# Patient Record
Sex: Male | Born: 1965 | Race: White | Hispanic: No | Marital: Married | Smoking: Never smoker
Health system: Southern US, Community
[De-identification: ages and names within clinical notes are randomized; demographics above are authoritative.]

## PROBLEM LIST (undated history)

## (undated) DIAGNOSIS — F329 Major depressive disorder, single episode, unspecified: Secondary | ICD-10-CM

## (undated) DIAGNOSIS — R51 Headache: Secondary | ICD-10-CM

## (undated) DIAGNOSIS — I10 Essential (primary) hypertension: Secondary | ICD-10-CM

## (undated) DIAGNOSIS — F32A Depression, unspecified: Secondary | ICD-10-CM

## (undated) DIAGNOSIS — N200 Calculus of kidney: Secondary | ICD-10-CM

## (undated) HISTORY — PX: WRIST SURGERY: SHX841

---

## 2007-09-10 ENCOUNTER — Emergency Department: Payer: Self-pay | Admitting: Emergency Medicine

## 2011-03-09 DIAGNOSIS — N200 Calculus of kidney: Secondary | ICD-10-CM

## 2011-03-09 HISTORY — DX: Calculus of kidney: N20.0

## 2011-11-04 ENCOUNTER — Other Ambulatory Visit: Payer: Self-pay | Admitting: Family Medicine

## 2011-11-04 DIAGNOSIS — R109 Unspecified abdominal pain: Secondary | ICD-10-CM

## 2011-11-05 ENCOUNTER — Ambulatory Visit
Admission: RE | Admit: 2011-11-05 | Discharge: 2011-11-05 | Disposition: A | Discharge status: Home / Self Care | Payer: Managed Care, Other (non HMO) | Source: Ambulatory Visit | Attending: Family Medicine | Admitting: Family Medicine

## 2011-11-05 DIAGNOSIS — R109 Unspecified abdominal pain: Secondary | ICD-10-CM

## 2011-11-10 ENCOUNTER — Other Ambulatory Visit: Payer: Self-pay | Admitting: Urology

## 2011-11-10 ENCOUNTER — Encounter (HOSPITAL_BASED_OUTPATIENT_CLINIC_OR_DEPARTMENT_OTHER): Payer: Self-pay | Admitting: *Deleted

## 2011-11-10 NOTE — Progress Notes (Signed)
To [facility] at  0615 -Istat,Ekg on arrival,Npo after Mn-will take amlodipine w/ sip water that am.

## 2011-11-15 NOTE — H&P (Signed)
History of Present Illness      This is a new patient referred by PA Lovenia Kim in consultation for ureteral stone. Patient was seen November 02, 2011 or about a week ago when he had a three-day history of left flank pain. He was given Toradol in the office. He was started on tamsulosin. His pain is better today. He has not passed a stone.   He underwent CT of the abdomen and pelvis without contrast November 05, 2011 and I reviewed the images. There was an 8 mm left distal calculus. More proximally there was an 8mm calculus at the junction of proximal to mid ureter. There was proximal hydroureteronephrosis. There were no renal, right ureter or bladder calculi. The calculi were visible on the scout image. He also had a KUB on November 02, 2011 and I reviewed this image. It was reversed when it was put into PACS as there was no left or right marker. It was dictated as a right distal and right mid ureteral stone. As the CT shows the stones are on the left.  There are no other associated signs or symptoms. There are no other aggravating or alleviating factors.  The patient has a history of kidney stones. He has always passed them.    Past Medical History Problems  1. History of  Anxiety (Symptom) 300.00 2. History of  Hypertension 401.9 3. History of  Nephrolithiasis V13.01  Surgical History Problems  1. History of  Complete Colonoscopy  Current Meds 1. AmLODIPine Besylate 5 MG Oral Tablet; Therapy: 13Jun2013 to 2. Hydrocodone-Acetaminophen 5-325 MG Oral Tablet; Therapy: 30Aug2013 to 3. Lisinopril 10 MG Oral Tablet; Therapy: (Recorded:03Sep2013) to 4. Oxycodone-Acetaminophen 5-325 MG Oral Tablet; Therapy: 27Aug2013 to 5. Tamsulosin HCl 0.4 MG Oral Capsule; Therapy: 27Aug2013 to 6. Venlafaxine HCl ER 75 MG Oral Capsule Extended Release 24 Hour; Therapy:  (Recorded:03Sep2013) to  Allergies Medication  1. Codeine Derivatives  Family History Problems  1. Family history of  Death In The Family  Mother age 58 of lung cancer 2. Maternal history of  Lung Cancer V16.1  Social History Problems    Caffeine Use 2 a day   Marital History - Currently Married   Occupation: Currently umemployed Denied    History of  Alcohol Use   History of  Tobacco Use 305.1  Review of Systems Genitourinary, constitutional, skin, eye, otolaryngeal, hematologic/lymphatic, cardiovascular, pulmonary, endocrine, musculoskeletal, gastrointestinal, neurological and psychiatric system(s) were reviewed and pertinent findings if present are noted.  Gastrointestinal: abdominal pain, diarrhea and constipation.  Musculoskeletal: back pain.    Vitals Vital Signs [Data Includes: Last 1 Day]  04Sep2013 09:22AM  BMI Calculated: 29.85 BSA Calculated: 2.2 Height: 5 ft 11.5 in Weight: 218 lb  Blood Pressure: 127 / 86 Temperature: 98.2 F Heart Rate: 76  Physical Exam Constitutional: Well nourished and well developed . No acute distress.  ENT:. The ears and nose are normal in appearance.  Neck: The appearance of the neck is normal and no neck mass is present.  Pulmonary: No respiratory distress and normal respiratory rhythm and effort.  Cardiovascular: Heart rate and rhythm are normal . No peripheral edema.  Abdomen: The abdomen is soft and nontender. No masses are palpated. No CVA tenderness. No hernias are palpable. No hepatosplenomegaly noted.  Lymphatics: The femoral and inguinal nodes are not enlarged or tender.  Skin: Normal skin turgor, no visible rash and no visible skin lesions.  Neuro/Psych:. Mood and affect are appropriate.    Results/Data  Old records or  history reviewed: 10 pages.  The following images/tracing/specimen were independently visualized: Marland Kitchen    Assessment Assessed  1. Microscopic Hematuria 599.72 2. Pyuria 791.9 3. Ureteral Stone 592.1 4. Hydronephrosis 591  Plan Health Maintenance (V70.0)  1. UA With REFLEX  Done: 04Sep2013 Microscopic Hematuria (599.72), Pyuria  (791.9)  2. URINE CULTURE  Requested for: 04Sep2013 Ureteral Stone (592.1)  3. Follow-up Schedule Surgery Office  Follow-up  Done: 04Sep2013  Discussion/Summary   Urine sent for Cx as a precaution.   Using the understanding kidney stones handout,  I discussed with patient CT and KUB findings. We discussed the nature, risks and benefits of continued stone passage with medical expulsion therapy (alpha blockers), endoscopic management with cysto/stent/ureteroscopy/laser lithotripsy (fail to gain retrograde access, need for repeat therapy/multiple procedures, ureteral injury, stent pain among others), ESWL (failure to fragment, failure to pass fragments, obstruction, need for additional procedures/stenting, bleeding requiring transfusion or embolization, infection, damage to kidney/ureter/other structures, pain among others). All questions answered. Pt elects to proceed with endoscopic management. We discussed treatment goals may not be reached and/or may require multiple/repeat procedures. We discussed the likelihood of achieving the goals of the procedure and potential problems that might occur during the procedure or recuperation.     cc: PA Lovenia Kim   Signatures Electronically signed by : Jerilee Field, M.D.; Nov 10 2011  9:52AM

## 2011-11-16 ENCOUNTER — Ambulatory Visit (HOSPITAL_BASED_OUTPATIENT_CLINIC_OR_DEPARTMENT_OTHER)
Admission: RE | Admit: 2011-11-16 | Discharge: 2011-11-16 | Disposition: A | Discharge status: Home / Self Care | Payer: Managed Care, Other (non HMO) | Source: Ambulatory Visit | Attending: Urology | Admitting: Urology

## 2011-11-16 ENCOUNTER — Encounter (HOSPITAL_BASED_OUTPATIENT_CLINIC_OR_DEPARTMENT_OTHER): Payer: Self-pay

## 2011-11-16 ENCOUNTER — Encounter (HOSPITAL_BASED_OUTPATIENT_CLINIC_OR_DEPARTMENT_OTHER)
Admission: RE | Disposition: A | Discharge status: Home / Self Care | Payer: Self-pay | Source: Ambulatory Visit | Attending: Urology

## 2011-11-16 ENCOUNTER — Encounter (HOSPITAL_BASED_OUTPATIENT_CLINIC_OR_DEPARTMENT_OTHER): Payer: Self-pay | Admitting: Anesthesiology

## 2011-11-16 ENCOUNTER — Ambulatory Visit (HOSPITAL_BASED_OUTPATIENT_CLINIC_OR_DEPARTMENT_OTHER): Payer: Managed Care, Other (non HMO) | Admitting: Anesthesiology

## 2011-11-16 DIAGNOSIS — I10 Essential (primary) hypertension: Secondary | ICD-10-CM | POA: Insufficient documentation

## 2011-11-16 DIAGNOSIS — Z79899 Other long term (current) drug therapy: Secondary | ICD-10-CM | POA: Insufficient documentation

## 2011-11-16 DIAGNOSIS — N1330 Unspecified hydronephrosis: Secondary | ICD-10-CM | POA: Insufficient documentation

## 2011-11-16 DIAGNOSIS — N201 Calculus of ureter: Secondary | ICD-10-CM | POA: Insufficient documentation

## 2011-11-16 HISTORY — DX: Major depressive disorder, single episode, unspecified: F32.9

## 2011-11-16 HISTORY — DX: Essential (primary) hypertension: I10

## 2011-11-16 HISTORY — DX: Calculus of kidney: N20.0

## 2011-11-16 HISTORY — DX: Depression, unspecified: F32.A

## 2011-11-16 HISTORY — PX: URETEROSCOPY: SHX842

## 2011-11-16 LAB — POCT I-STAT 4, (NA,K, GLUC, HGB,HCT)
Hemoglobin: 13.9 g/dL (ref 13.0–17.0)
Potassium: 3.8 mEq/L (ref 3.5–5.1)
Sodium: 139 mEq/L (ref 135–145)

## 2011-11-16 SURGERY — URETEROSCOPY
Anesthesia: General | Site: Ureter | Laterality: Left | Wound class: Clean Contaminated

## 2011-11-16 MED ORDER — IOHEXOL 350 MG/ML SOLN
INTRAVENOUS | Status: DC | PRN
  Administered 2011-11-16: 6 mL

## 2011-11-16 MED ORDER — OXYCODONE HCL 5 MG/5ML PO SOLN: 5.0000 mg | Freq: Once | ORAL | Status: DC | PRN

## 2011-11-16 MED ORDER — OXYCODONE-ACETAMINOPHEN 5-325 MG PO TABS: 1.0000 | ORAL_TABLET | ORAL | Status: AC | PRN

## 2011-11-16 MED ORDER — GLYCOPYRROLATE 0.2 MG/ML IJ SOLN
INTRAMUSCULAR | Status: DC | PRN
  Administered 2011-11-16: 0.2 mg via INTRAVENOUS

## 2011-11-16 MED ORDER — OXYCODONE-ACETAMINOPHEN 5-325 MG PO TABS
1.0000 | ORAL_TABLET | Freq: Once | ORAL | Status: AC
  Administered 2011-11-16: 1 via ORAL

## 2011-11-16 MED ORDER — CEFAZOLIN SODIUM-DEXTROSE 2-3 GM-% IV SOLR
2.0000 g | INTRAVENOUS | Status: AC
  Administered 2011-11-16: 2 g via INTRAVENOUS

## 2011-11-16 MED ORDER — LIDOCAINE HCL (CARDIAC) 20 MG/ML IV SOLN
INTRAVENOUS | Status: DC | PRN
  Administered 2011-11-16: 75 mg via INTRAVENOUS

## 2011-11-16 MED ORDER — CIPROFLOXACIN HCL 500 MG PO TABS: 500.0000 mg | ORAL_TABLET | Freq: Two times a day (BID) | ORAL | Status: AC

## 2011-11-16 MED ORDER — CEFAZOLIN SODIUM 1-5 GM-% IV SOLN: 1.0000 g | INTRAVENOUS | Status: DC

## 2011-11-16 MED ORDER — BELLADONNA ALKALOIDS-OPIUM 16.2-60 MG RE SUPP
RECTAL | Status: DC | PRN
  Administered 2011-11-16: 1 via RECTAL

## 2011-11-16 MED ORDER — ACETAMINOPHEN 10 MG/ML IV SOLN: 1000.0000 mg | Freq: Once | INTRAVENOUS | Status: DC | PRN

## 2011-11-16 MED ORDER — OXYCODONE HCL 5 MG PO TABS: 5.0000 mg | ORAL_TABLET | Freq: Once | ORAL | Status: DC | PRN

## 2011-11-16 MED ORDER — HYDROMORPHONE HCL PF 1 MG/ML IJ SOLN: 0.2500 mg | INTRAMUSCULAR | Status: DC | PRN

## 2011-11-16 MED ORDER — LACTATED RINGERS IV SOLN
INTRAVENOUS | Status: DC
  Administered 2011-11-16 (×3): via INTRAVENOUS

## 2011-11-16 MED ORDER — DEXAMETHASONE SODIUM PHOSPHATE 4 MG/ML IJ SOLN
INTRAMUSCULAR | Status: DC | PRN
  Administered 2011-11-16: 10 mg via INTRAVENOUS

## 2011-11-16 MED ORDER — PROPOFOL 10 MG/ML IV BOLUS
INTRAVENOUS | Status: DC | PRN
  Administered 2011-11-16: 220 mg via INTRAVENOUS

## 2011-11-16 MED ORDER — FENTANYL CITRATE 0.05 MG/ML IJ SOLN
INTRAMUSCULAR | Status: DC | PRN
  Administered 2011-11-16 (×2): 25 ug via INTRAVENOUS
  Administered 2011-11-16: 50 ug via INTRAVENOUS
  Administered 2011-11-16 (×5): 25 ug via INTRAVENOUS
  Administered 2011-11-16: 50 ug via INTRAVENOUS
  Administered 2011-11-16 (×2): 25 ug via INTRAVENOUS

## 2011-11-16 MED ORDER — PROMETHAZINE HCL 25 MG/ML IJ SOLN: 6.2500 mg | INTRAMUSCULAR | Status: DC | PRN

## 2011-11-16 MED ORDER — MIDAZOLAM HCL 5 MG/5ML IJ SOLN
INTRAMUSCULAR | Status: DC | PRN
  Administered 2011-11-16: 1 mg via INTRAVENOUS

## 2011-11-16 MED ORDER — MEPERIDINE HCL 25 MG/ML IJ SOLN: 6.2500 mg | INTRAMUSCULAR | Status: DC | PRN

## 2011-11-16 MED ORDER — ONDANSETRON HCL 4 MG/2ML IJ SOLN
INTRAMUSCULAR | Status: DC | PRN
  Administered 2011-11-16: 4 mg via INTRAVENOUS

## 2011-11-16 MED ORDER — EPHEDRINE SULFATE 50 MG/ML IJ SOLN
INTRAMUSCULAR | Status: DC | PRN
  Administered 2011-11-16: 10 mg via INTRAVENOUS
  Administered 2011-11-16 (×2): 15 mg via INTRAVENOUS
  Administered 2011-11-16: 10 mg via INTRAVENOUS

## 2011-11-16 MED ORDER — SODIUM CHLORIDE 0.9 % IR SOLN
Status: DC | PRN
  Administered 2011-11-16: 6000 mL

## 2011-11-16 SURGICAL SUPPLY — 39 items
ADAPTER CATH URET PLST 4-6FR (CATHETERS) ×2 IMPLANT
BAG DRAIN URO-CYSTO SKYTR STRL (DRAIN) ×2 IMPLANT
BASKET LASER NITINOL 1.9FR (BASKET) ×2 IMPLANT
BASKET STNLS GEMINI 4WIRE 3FR (BASKET) IMPLANT
BASKET ZERO TIP NITINOL 2.4FR (BASKET) IMPLANT
BRUSH URET BIOPSY 3F (UROLOGICAL SUPPLIES) IMPLANT
CANISTER SUCT LVC 12 LTR MEDI- (MISCELLANEOUS) ×2 IMPLANT
CATH INTERMIT  6FR 70CM (CATHETERS) IMPLANT
CATH URET 5FR 28IN CONE TIP (BALLOONS)
CATH URET 5FR 28IN OPEN ENDED (CATHETERS) ×2 IMPLANT
CATH URET 5FR 70CM CONE TIP (BALLOONS) IMPLANT
CATH URET DUAL LUMEN 6-10FR 50 (CATHETERS) ×2 IMPLANT
CLOTH BEACON ORANGE TIMEOUT ST (SAFETY) ×2 IMPLANT
DRAPE CAMERA CLOSED 9X96 (DRAPES) ×2 IMPLANT
ELECT REM PT RETURN 9FT ADLT (ELECTROSURGICAL)
ELECTRODE REM PT RTRN 9FT ADLT (ELECTROSURGICAL) IMPLANT
FIBER LASER TRAC TIP (UROLOGICAL SUPPLIES) ×4 IMPLANT
GLOVE BIO SURGEON STRL SZ7.5 (GLOVE) ×2 IMPLANT
GLOVE BIOGEL PI IND STRL 7.0 (GLOVE) ×1 IMPLANT
GLOVE BIOGEL PI INDICATOR 7.0 (GLOVE) ×1
GLOVE INDICATOR 6.5 STRL GRN (GLOVE) ×2 IMPLANT
GOWN PREVENTION PLUS LG XLONG (DISPOSABLE) ×2 IMPLANT
GOWN STRL REIN XL XLG (GOWN DISPOSABLE) ×2 IMPLANT
GUIDEWIRE 0.038 PTFE COATED (WIRE) IMPLANT
GUIDEWIRE ANG ZIPWIRE 038X150 (WIRE) ×2 IMPLANT
GUIDEWIRE STR DUAL SENSOR (WIRE) ×2 IMPLANT
IV NS IRRIG 3000ML ARTHROMATIC (IV SOLUTION) ×4 IMPLANT
KIT BALLIN UROMAX 15FX10 (LABEL) IMPLANT
KIT BALLN UROMAX 15FX4 (MISCELLANEOUS) IMPLANT
KIT BALLN UROMAX 26 75X4 (MISCELLANEOUS)
LASER FIBER DISP (UROLOGICAL SUPPLIES) IMPLANT
PACK CYSTOSCOPY (CUSTOM PROCEDURE TRAY) ×2 IMPLANT
SET HIGH PRES BAL DIL (LABEL)
SHEATH ACCESS URETERAL 38CM (SHEATH) ×2 IMPLANT
SHEATH ACCESS URETERAL 54CM (SHEATH) IMPLANT
SHEATH URET ACCESS 12FR/35CM (UROLOGICAL SUPPLIES) IMPLANT
SHEATH URET ACCESS 12FR/55CM (UROLOGICAL SUPPLIES) IMPLANT
STENT URET 6FRX26 CONTOUR (STENTS) ×2 IMPLANT
SYRINGE IRR TOOMEY STRL 70CC (SYRINGE) IMPLANT

## 2011-11-16 NOTE — Anesthesia Procedure Notes (Signed)
Procedure Name: LMA Insertion Date/Time: 11/16/2011 7:36 AM Performed by: Fran Lowes Pre-anesthesia Checklist: Patient identified, Emergency Drugs available, Suction available and Patient being monitored Patient Re-evaluated:Patient Re-evaluated prior to inductionOxygen Delivery Method: Circle System Utilized Preoxygenation: Pre-oxygenation with 100% oxygen Intubation Type: IV induction Ventilation: Mask ventilation without difficulty LMA: LMA inserted LMA Size: 4.0 Number of attempts: 1 Airway Equipment and Method: bite block Placement Confirmation: positive ETCO2 Tube secured with: Tape Dental Injury: Teeth and Oropharynx as per pre-operative assessment

## 2011-11-16 NOTE — Interval H&P Note (Signed)
History and Physical Interval Note:  11/16/2011 7:31 AM  Jerry Vance  has presented today for surgery, with the diagnosis of LEFT URETERAL STONES AND LEFT HYDRONEPHROSIS  The various methods of treatment have been discussed with the patient and family. After consideration of risks, benefits and other options for treatment, the patient has consented to  Procedure(s) (LRB) with comments: URETEROSCOPY (Left) - LEFT URETEROSCOPY LASER LITHOTRIPSY AND STENT PLACEMENT HOLMIUM C-ARM HOLMIUM LASER APPLICATION (Left) as a surgical intervention .  The patient's history has been reviewed, patient examined, no change in status, stable for surgery.  I have reviewed the patient's chart and labs.  Questions were answered to the patient's satisfaction.  We discussed there is a good chance of needing a staged procedure given his stone burden and proximal nature of some of the stones.    Antony Haste

## 2011-11-16 NOTE — Anesthesia Preprocedure Evaluation (Signed)
Anesthesia Evaluation  Patient identified by MRN, date of birth, ID band Patient awake    Reviewed: Allergy & Precautions, H&P , NPO status , Patient's Chart, lab work & pertinent test results  Airway Mallampati: II TM Distance: >3 FB Neck ROM: Full    Dental  (+) Dental Advisory Given   Pulmonary neg pulmonary ROS,  breath sounds clear to auscultation  Pulmonary exam normal       Cardiovascular hypertension, Pt. on medications Rhythm:Regular Rate:Normal     Neuro/Psych Depression negative neurological ROS     GI/Hepatic negative GI ROS, Neg liver ROS,   Endo/Other  negative endocrine ROS  Renal/GU Renal disease     Musculoskeletal negative musculoskeletal ROS (+)   Abdominal   Peds  Hematology negative hematology ROS (+)   Anesthesia Other Findings   Reproductive/Obstetrics                           Anesthesia Physical Anesthesia Plan  ASA: II  Anesthesia Plan: General   Post-op Pain Management:    Induction: Intravenous  Airway Management Planned: LMA  Additional Equipment:   Intra-op Plan:   Post-operative Plan:   Informed Consent: I have reviewed the patients History and Physical, chart, labs and discussed the procedure including the risks, benefits and alternatives for the proposed anesthesia with the patient or authorized representative who has indicated his/her understanding and acceptance.   Dental advisory given  Plan Discussed with: CRNA and Surgeon  Anesthesia Plan Comments:         Anesthesia Quick Evaluation

## 2011-11-16 NOTE — Brief Op Note (Addendum)
11/16/2011  9:21 AM  PATIENT:  Jerry Vance  46 y.o. male  PRE-OPERATIVE DIAGNOSIS:  LEFT URETERAL STONES AND LEFT HYDRONEPHROSIS  POST-OPERATIVE DIAGNOSIS:  LEFT URETERAL STONES AND LEFT HYDRONEPHROSIS  PROCEDURE:  Procedure(s) (LRB) with comments: URETEROSCOPY (Left) HOLMIUM LASER APPLICATION (Left) CYSTOSCOPY WITH STENT PLACEMENT (Left) Left retrograde pyelogram   SURGEON:  Surgeon(s) and Role:    * Antony Haste, MD - Primary   ANESTHESIA:   general  EBL:  Total I/O In: 1000 [I.V.:1000] Out: -   BLOOD ADMINISTERED:none  DRAINS: 6 x 26 cm JJ left ureteral stent -- no string   SPECIMEN:  Source of Specimen:  left ureteral stone fragments  DISPOSITION OF SPECIMEN:  N/A office lab  DICTATION: .Other Dictation: Dictation Number #[id number]  PLAN OF CARE: Discharge to home after PACU  PATIENT DISPOSITION:  PACU - hemodynamically stable.   Delay start of Pharmacological VTE agent (>24hrs) due to surgical blood loss or risk of bleeding: not applicable

## 2011-11-16 NOTE — Anesthesia Postprocedure Evaluation (Signed)
Anesthesia Post Note  Patient: Jerry Vance  Procedure(s) Performed: Procedure(s) (LRB): URETEROSCOPY (Left) HOLMIUM LASER APPLICATION (Left) CYSTOSCOPY WITH STENT PLACEMENT (Left)  Anesthesia type: General  Patient location: PACU  Post pain: Pain level controlled  Post assessment: Post-op Vital signs reviewed  Last Vitals: BP 139/85  Pulse 97  Temp 36.2 C (Oral)  Resp 18  SpO2 95%  Post vital signs: Reviewed  Level of consciousness: sedated  Complications: No apparent anesthesia complications

## 2011-11-16 NOTE — Transfer of Care (Signed)
Immediate Anesthesia Transfer of Care Note  Patient: Jerry Vance  Procedure(s) Performed: Procedure(s) (LRB): URETEROSCOPY (Left) HOLMIUM LASER APPLICATION (Left) CYSTOSCOPY WITH STENT PLACEMENT (Left)  Patient Location: Patient transported to PACU with oxygen via face mask at 4 Liters / Min  Anesthesia Type: General  Level of Consciousness: awake and alert   Airway & Oxygen Therapy: Patient Spontanous Breathing and Patient connected to face mask oxygen  Post-op Assessment: Report given to PACU RN and Post -op Vital signs reviewed and stable  Post vital signs: Reviewed and stable  Dentition: Teeth and oropharynx remain in pre-op condition  Complications: No apparent anesthesia complications

## 2011-11-17 ENCOUNTER — Encounter (HOSPITAL_BASED_OUTPATIENT_CLINIC_OR_DEPARTMENT_OTHER): Payer: Self-pay | Admitting: Urology

## 2011-11-17 NOTE — Op Note (Signed)
NAME:  Jerry Vance, PALMA NO.:  [id number]  MEDICAL RECORD NO.:  [id number]  LOCATION:                               FACILITY:  [facility]  PHYSICIAN:  Jerilee Field, MD   DATE OF BIRTH:  04-10-1965  DATE OF PROCEDURE:  11/16/2011 DATE OF DISCHARGE:                              OPERATIVE REPORT   PREOPERATIVE DIAGNOSIS:  Left ureteral stone, left hydronephrosis.  POSTOPERATIVE DIAGNOSIS:  Left ureteral stone, left hydronephrosis.  PROCEDURE:  Cystoscopy with left retrograde pyelogram, left ureteroscopy, Holmium laser lithotripsy, stone basket extraction, and left ureteral stent placement.  SURGEON:  Jerilee Field, MD  TYPE OF ANESTHESIA:  General.  FINDINGS:  On cystoscopy - the urethra was normal, the prostate was short nonobstructive.  The bladder appeared normal without stone, foreign body, or carcinoma.  Left retrograde pyelogram - for the large filling defect in the left distal ureter consistent with a distal stone with a dilated ureter proximal to this.  Contrast field of dilated proximal ureter, dilated renal pelvis, and dilated collecting system.  DESCRIPTION OF PROCEDURE:  After consent was obtained, the patient was brought to the operating room.  Time-out was performed to confirm the patient and procedure.  After adequate anesthesia, he was placed in a lithotomy position and prepped and draped in the usual sterile fashion. A cystoscope was passed per urethra, and a 6-French open-ended catheter was cannulated into the left ureteral orifice and retrograde injection of contrast was performed.  A Sensor wire was then advanced past the distal stone, but would not pass the proximal stone.  The 6-French open- ended catheter was then advanced into the ureter up to the proximal stone and now bracing the Sensor wire, it advanced past the proximal stone and was coiled in the kidney.  A 6-French open-ended catheter would not pass the stone as the stone was  large in size and impacted. Therefore the 6-French open-ended catheter was removed and a 5-French open-ended catheter was advanced, which did go past the proximal stone to the region of the left renal pelvis.  The wire was removed and a 2nd retrograde injection of contrast was performed to confirm proper wire placement.  These findings were previously dictated.  The Sensor wire was then coiled back in the kidney and the 6-French open-ended catheter removed.  I then tried to pass the ureteroscope into the distal ureter, but the ureterovesical junction was too tight, therefore it was gently dilated with ureteral access sheath without difficulty.  Now the rigid ureteroscope was advanced into the left distal ureter, where the left distal ureter was dilated and a large stone was found.  The stone was then fragmented with 200-micron laser fiber at a setting of 0.6 and 15 small fragments.  Some of these fragments were removed with the escape basket.  Some of the clinically insignificant fragments simply washed away.  Having cleared the distal stone, I tried to access the proximal stone with the rigid scope, but it would not go over the iliacs, therefore a glidewire was left in the distal and mid ureter.  Over the guide wire, ureteral access sheath was placed without difficulty up to the level of  the proximal stone.  Using the ureteral access sheath, a digital ureteroscope was advanced up to the proximal stone, there was significant bullous edema that had grown around the stone and stone impaction.  Using the 260-micron laser fiber, the stone was carefully lasered at the same settings.  As the pieces broke up, some were removed with the escape basket.  Finally all of the stone was fragmented and access to the proximal ureter was obtained.  There were 1 or 2 small fragments in the proximal ureter.  These were removed with the escape basket.  Inspection of the ureter not covered by the ureteral  access sheath revealed no further stone fragments in the ureter.  There was significant edema and shaggy mucosa where the stone had been impacted, therefore decided to leave a ureteral stent.  The access sheath was backed out on the ureteroscope and again the ureter was inspected now in its entirety and noted to be intact without injury and free of any stone fragments.  The wire was then back-loaded on a cystoscope and a 6 x 26 cm stent was placed under fluoroscopic and cystoscopic guidance.  The wire was removed.  Good coil was seen in the kidney and a good coil in the bladder.  The patient's bladder was drained and the scope removed. Some lidocaine jelly was placed per urethra and a B and O suppository per rectum.  He was then awakened, taken to the recovery room in a stable condition.  COMPLICATIONS:  None.  ESTIMATED BLOOD LOSS:  Minimal.  DRAINS:  6 x 26 cm left ureteral stent without a string.  SPECIMEN:  Stone fragments to office lab.  DISPOSITION:  The patient stable to PACU.  He will be discharged to home after he recovers.          ______________________________ Jerilee Field, MD     ME/MEDQ  D:  11/16/2011  T:  11/17/2011  Job:  [id number]

## 2011-11-22 ENCOUNTER — Encounter (HOSPITAL_BASED_OUTPATIENT_CLINIC_OR_DEPARTMENT_OTHER): Payer: Self-pay

## 2012-01-18 ENCOUNTER — Encounter (INDEPENDENT_AMBULATORY_CARE_PROVIDER_SITE_OTHER): Payer: Self-pay | Admitting: Surgery

## 2012-01-18 ENCOUNTER — Ambulatory Visit (INDEPENDENT_AMBULATORY_CARE_PROVIDER_SITE_OTHER): Payer: Managed Care, Other (non HMO) | Admitting: Surgery

## 2012-01-18 VITALS — BP 142/90 | HR 85 | Temp 98.0°F | Ht 69.5 in | Wt 219.0 lb

## 2012-01-18 DIAGNOSIS — K648 Other hemorrhoids: Secondary | ICD-10-CM

## 2012-01-18 DIAGNOSIS — K644 Residual hemorrhoidal skin tags: Secondary | ICD-10-CM | POA: Insufficient documentation

## 2012-01-18 MED ORDER — HYDROCORTISONE 2.5 % RE CREA: TOPICAL_CREAM | Freq: Two times a day (BID) | RECTAL | Status: DC
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
## 2012-01-18 NOTE — Patient Instructions (Signed)
Daily fiber supplement Stool softeners/ laxatives as needed for constipation Anusol ointment twice daily Frequent sitz baths

## 2012-01-24 ENCOUNTER — Ambulatory Visit (INDEPENDENT_AMBULATORY_CARE_PROVIDER_SITE_OTHER): Payer: Self-pay | Admitting: General Surgery

## 2012-02-11 ENCOUNTER — Encounter (INDEPENDENT_AMBULATORY_CARE_PROVIDER_SITE_OTHER): Payer: Managed Care, Other (non HMO) | Admitting: Surgery

## 2012-02-21 ENCOUNTER — Encounter (INDEPENDENT_AMBULATORY_CARE_PROVIDER_SITE_OTHER): Payer: Self-pay

## 2012-07-07 ENCOUNTER — Institutional Professional Consult (permissible substitution): Payer: Managed Care, Other (non HMO) | Admitting: Internal Medicine

## 2012-11-07 ENCOUNTER — Other Ambulatory Visit: Payer: Self-pay | Admitting: Orthopedic Surgery

## 2012-11-07 DIAGNOSIS — M542 Cervicalgia: Secondary | ICD-10-CM

## 2012-11-11 ENCOUNTER — Ambulatory Visit
Admission: RE | Admit: 2012-11-11 | Discharge: 2012-11-11 | Disposition: A | Discharge status: Home / Self Care | Payer: Managed Care, Other (non HMO) | Source: Ambulatory Visit | Attending: Orthopedic Surgery | Admitting: Orthopedic Surgery

## 2012-11-11 DIAGNOSIS — M542 Cervicalgia: Secondary | ICD-10-CM

## 2013-01-02 ENCOUNTER — Other Ambulatory Visit (HOSPITAL_COMMUNITY): Payer: Self-pay | Admitting: Orthopaedic Surgery

## 2013-01-08 ENCOUNTER — Encounter (HOSPITAL_COMMUNITY): Payer: Self-pay

## 2013-01-12 ENCOUNTER — Encounter (HOSPITAL_COMMUNITY)
Admission: RE | Admit: 2013-01-12 | Discharge: 2013-01-12 | Disposition: A | Discharge status: Home / Self Care | Payer: Managed Care, Other (non HMO) | Source: Ambulatory Visit | Attending: Orthopaedic Surgery | Admitting: Orthopaedic Surgery

## 2013-01-12 ENCOUNTER — Encounter (HOSPITAL_COMMUNITY): Payer: Self-pay

## 2013-01-12 DIAGNOSIS — Z01812 Encounter for preprocedural laboratory examination: Secondary | ICD-10-CM | POA: Insufficient documentation

## 2013-01-12 DIAGNOSIS — Z01810 Encounter for preprocedural cardiovascular examination: Secondary | ICD-10-CM | POA: Insufficient documentation

## 2013-01-12 DIAGNOSIS — Z01818 Encounter for other preprocedural examination: Secondary | ICD-10-CM | POA: Insufficient documentation

## 2013-01-12 HISTORY — DX: Headache: R51

## 2013-01-12 LAB — PROTIME-INR
INR: 0.94 (ref 0.00–1.49)
Prothrombin Time: 12.4 seconds (ref 11.6–15.2)

## 2013-01-12 LAB — URINALYSIS, ROUTINE W REFLEX MICROSCOPIC
Ketones, ur: NEGATIVE mg/dL
Leukocytes, UA: NEGATIVE
Protein, ur: NEGATIVE mg/dL
Urobilinogen, UA: 0.2 mg/dL (ref 0.0–1.0)

## 2013-01-12 LAB — COMPREHENSIVE METABOLIC PANEL
Albumin: 4.1 g/dL (ref 3.5–5.2)
BUN: 14 mg/dL (ref 6–23)
Calcium: 9.2 mg/dL (ref 8.4–10.5)
Creatinine, Ser: 0.81 mg/dL (ref 0.50–1.35)
GFR calc Af Amer: >90 mL/min (ref >90)
GFR calc non Af Amer: >90 mL/min (ref >90)
Glucose, Bld: 101 mg/dL — ABNORMAL HIGH (ref 70–99)
Sodium: 137 mEq/L (ref 135–145)
Total Protein: 8.2 g/dL (ref 6.0–8.3)

## 2013-01-12 LAB — CBC
Hemoglobin: 15.5 g/dL (ref 13.0–17.0)
MCH: 30.2 pg (ref 26.0–34.0)
MCHC: 35 g/dL (ref 30.0–36.0)
MCV: 86.2 fL (ref 78.0–100.0)
RDW: 13.8 % (ref 11.5–15.5)

## 2013-01-12 LAB — SURGICAL PCR SCREEN: MRSA, PCR: NEGATIVE

## 2013-01-12 NOTE — Pre-Procedure Instructions (Signed)
Jerry Vance  01/12/2013   Your procedure is scheduled on:  Monday, November 17  Report to [facility]  2 * 3 at 1030 AM.  Call this number if you have problems the morning of surgery: [phone]   Remember:   Do not eat food or drink liquids after midnight. Sunday night   Take these medicines the morning of surgery with A SIP OF WATER: Amlodipine,Venlaxafaxine ,              Oxycodone, if needed   Do not wear jewelry, make-up or nail polish.  Do not wear lotions, powders, or perfumes. You may wear deodorant.  Do not shave 48 hours prior to surgery. Men may shave face and neck.  Do not bring valuables to the hospital.  [facility] is not responsible for any belongings or valuables.               Contacts, dentures or bridgework may not be worn into surgery.  Leave suitcase in the car. After surgery it may be brought to your room.  For patients admitted to the hospital, discharge time is determined by your                treatment team.               Special Instructions: Shower using CHG 2 nights before surgery and the night before surgery.  If you shower the day of surgery use CHG.  Use special wash - you have one bottle of CHG for all showers.  You should use approximately 1/3 of the bottle for each shower.   Please read over the following fact sheets that you were given: Pain Booklet, Coughing and Deep Breathing and Surgical Site Infection Prevention

## 2013-01-19 NOTE — H&P (Signed)
[facility]   A Division of [employer]   [address] [place], [place] [address] Telephone: [phone]  Fax: [phone]     PATIENT: Jerry Vance, Jerry Vance   MR#: [id number]  DOB: [birth year]       A 47 year old male returns with persistent problems with neck pain and left arm pain with dorsal hand numbness.  He has had progressive left triceps weakness, weakness in left wrist flexion and absent triceps reflex on exam.  MRI scan from 11/13/2012 showed broad based disk osteophyte complex with left posterolateral protrusion and left side of cord flattening with left foraminal narrowing and minimal right foraminal narrowing.  He has been treated with pain medication originally, some hydrocodone, now has been having to take Percocet regularly to tolerate the pain.  He has used muscle relaxants.  He has been on a Medrol steroid dose pack without relief.  He has continued to work and states at this point he does not feel that he can continue due to significant increase in pain.  The patient has been putting his hand on top of his head to get some relief intermittently.  He has pain that wakes him up at night.  He has difficulty getting in a comfortable position.  Sometimes he is better in a recliner with his arm propped up to the side.     CURRENT MEDICATIONS:   Include amlodipine, Effexor and also the Percocet.  He has had a previous kidney stone removal 2013.     FAMILY HISTORY:   Positive for diabetes, lung and skin cancer and hypertension.     SOCIAL HISTORY:   The patient is married to his wife Kenney Houseman.  He works as a Psychologist, forensic.  Does not smoke.  Occasionally drinks 1 or 2 drinks a month.     REVIEW OF SYSTEMS:   A 14-point review of systems positive for hypertension and history of mild depression.   PHYSICAL EXAMINATION:  The patient alert and oriented, in moderate distress.  Height 5 feet 11 inches.  Weight 220.  Forward flexion cervical  spine within 3 fingerbreadths chin to chest, negative Lhermitte.  He has only 50% extension due to pain.  Extreme brachial plexus tenderness on the left.  Absent triceps reflex on the left, 2+ on the right.  Biceps and brachioradialis are 2+ and symmetrical.  No triceps or wrist flexion weakness on the opposite right side.  The patient is alert and oriented.  No supraclavicular lymphadenopathy.   Can overcome his triceps with 2 fingers.  He has triceps atrophy, weakness with wrist flexion, interossei are strong, good EPL and EDC finger extension without deficit.  Decreased sensation to the index, long and portion of ring finger.  Sensation of his little finger is normal.     ASSESSMENT:  Cervical spondylosis with disk protrusion, cord flattening, radiculopathy and progressive weakness.  His symptoms have been present for more than 4 months with progressive weakness.     PLAN:  We discussed options.  He has requested proceeding with operative intervention.  Refill Percocet given.  The patient is scheduled for 1 level cervical discectomy, fusion, allograft plate, overnight stay in the hospital and 6 weeks collar immobilization postop then he should be able to resume all work activities.  We discussed dysphonia, dysphasia, reoperation.  Risks of each potential complication was discussed.  The patient request we proceed.   For additional information please see handwritten notes, reports, orders and prescriptions in this chart.  Enola Siebers C. Ophelia Charter, M.D.    Auto-Authenticated by Veverly Fells. Ophelia Charter, M.D.

## 2013-01-21 MED ORDER — CEFAZOLIN SODIUM-DEXTROSE 2-3 GM-% IV SOLR
2.0000 g | INTRAVENOUS | Status: AC
  Administered 2013-01-22: 2 g via INTRAVENOUS
  Filled 2013-01-21: qty 50

## 2013-01-22 ENCOUNTER — Encounter (HOSPITAL_COMMUNITY): Payer: Self-pay | Admitting: Surgery

## 2013-01-22 ENCOUNTER — Encounter (HOSPITAL_COMMUNITY)
Admission: RE | Disposition: A | Discharge status: Home / Self Care | Payer: Self-pay | Source: Ambulatory Visit | Attending: Orthopaedic Surgery

## 2013-01-22 ENCOUNTER — Inpatient Hospital Stay (HOSPITAL_COMMUNITY)
Admission: RE | Admit: 2013-01-22 | Discharge: 2013-01-23 | DRG: 473 | Disposition: A | Discharge status: Home / Self Care | Payer: Managed Care, Other (non HMO) | Source: Ambulatory Visit | Attending: Orthopaedic Surgery | Admitting: Orthopaedic Surgery

## 2013-01-22 ENCOUNTER — Encounter (HOSPITAL_COMMUNITY): Payer: Managed Care, Other (non HMO) | Admitting: Certified Registered"

## 2013-01-22 ENCOUNTER — Inpatient Hospital Stay (HOSPITAL_COMMUNITY): Payer: Managed Care, Other (non HMO)

## 2013-01-22 ENCOUNTER — Ambulatory Visit (HOSPITAL_COMMUNITY): Payer: Managed Care, Other (non HMO) | Admitting: Certified Registered"

## 2013-01-22 DIAGNOSIS — M5020 Other cervical disc displacement, unspecified cervical region: Secondary | ICD-10-CM | POA: Diagnosis present

## 2013-01-22 DIAGNOSIS — Z801 Family history of malignant neoplasm of trachea, bronchus and lung: Secondary | ICD-10-CM

## 2013-01-22 DIAGNOSIS — I10 Essential (primary) hypertension: Secondary | ICD-10-CM | POA: Diagnosis present

## 2013-01-22 DIAGNOSIS — F341 Dysthymic disorder: Secondary | ICD-10-CM | POA: Diagnosis present

## 2013-01-22 DIAGNOSIS — Z808 Family history of malignant neoplasm of other organs or systems: Secondary | ICD-10-CM

## 2013-01-22 DIAGNOSIS — Z8249 Family history of ischemic heart disease and other diseases of the circulatory system: Secondary | ICD-10-CM

## 2013-01-22 DIAGNOSIS — Z833 Family history of diabetes mellitus: Secondary | ICD-10-CM

## 2013-01-22 DIAGNOSIS — M47812 Spondylosis without myelopathy or radiculopathy, cervical region: Principal | ICD-10-CM | POA: Diagnosis present

## 2013-01-22 HISTORY — PX: ANTERIOR CERVICAL DECOMP/DISCECTOMY FUSION: SHX1161

## 2013-01-22 SURGERY — ANTERIOR CERVICAL DECOMPRESSION/DISCECTOMY FUSION 1 LEVEL
Anesthesia: General | Site: Neck | Wound class: Clean

## 2013-01-22 MED ORDER — MIDAZOLAM HCL 5 MG/5ML IJ SOLN
INTRAMUSCULAR | Status: DC | PRN
  Administered 2013-01-22: 2 mg via INTRAVENOUS

## 2013-01-22 MED ORDER — LACTATED RINGERS IV SOLN
INTRAVENOUS | Status: DC | PRN
  Administered 2013-01-22 (×2): via INTRAVENOUS

## 2013-01-22 MED ORDER — PROPOFOL 10 MG/ML IV BOLUS
INTRAVENOUS | Status: DC | PRN
  Administered 2013-01-22: 250 mg via INTRAVENOUS

## 2013-01-22 MED ORDER — SODIUM CHLORIDE 0.9 % IJ SOLN: 3.0000 mL | Freq: Two times a day (BID) | INTRAMUSCULAR | Status: DC

## 2013-01-22 MED ORDER — HYDROMORPHONE HCL PF 1 MG/ML IJ SOLN
0.2500 mg | INTRAMUSCULAR | Status: DC | PRN
  Administered 2013-01-22 (×3): 0.5 mg via INTRAVENOUS

## 2013-01-22 MED ORDER — ZOLPIDEM TARTRATE 5 MG PO TABS: 5.0000 mg | ORAL_TABLET | Freq: Every evening | ORAL | Status: DC | PRN

## 2013-01-22 MED ORDER — KETOROLAC TROMETHAMINE 30 MG/ML IJ SOLN
30.0000 mg | Freq: Once | INTRAMUSCULAR | Status: AC
  Administered 2013-01-22: 30 mg via INTRAVENOUS
  Filled 2013-01-22 (×2): qty 1

## 2013-01-22 MED ORDER — BUPIVACAINE-EPINEPHRINE (PF) 0.5% -1:200000 IJ SOLN
INTRAMUSCULAR | Status: AC
  Filled 2013-01-22: qty 10

## 2013-01-22 MED ORDER — KETOROLAC TROMETHAMINE 30 MG/ML IJ SOLN
INTRAMUSCULAR | Status: AC
  Administered 2013-01-22: 30 mg
  Filled 2013-01-22: qty 1

## 2013-01-22 MED ORDER — BUPIVACAINE-EPINEPHRINE 0.5% -1:200000 IJ SOLN
INTRAMUSCULAR | Status: DC | PRN
  Administered 2013-01-22: 7 mL

## 2013-01-22 MED ORDER — ONDANSETRON HCL 4 MG/2ML IJ SOLN
INTRAMUSCULAR | Status: DC | PRN
  Administered 2013-01-22: 4 mg via INTRAVENOUS

## 2013-01-22 MED ORDER — BISACODYL 5 MG PO TBEC: 5.0000 mg | DELAYED_RELEASE_TABLET | Freq: Every day | ORAL | Status: DC | PRN

## 2013-01-22 MED ORDER — PHENOL 1.4 % MT LIQD: 1.0000 | OROMUCOSAL | Status: DC | PRN

## 2013-01-22 MED ORDER — LACTATED RINGERS IV SOLN
INTRAVENOUS | Status: DC
  Administered 2013-01-22: 11:00:00 via INTRAVENOUS

## 2013-01-22 MED ORDER — HYDROMORPHONE HCL PF 1 MG/ML IJ SOLN
INTRAMUSCULAR | Status: AC
  Filled 2013-01-22: qty 1

## 2013-01-22 MED ORDER — OXYCODONE-ACETAMINOPHEN 5-325 MG PO TABS: 1.0000 | ORAL_TABLET | ORAL | Status: DC | PRN

## 2013-01-22 MED ORDER — LIDOCAINE HCL (CARDIAC) 20 MG/ML IV SOLN
INTRAVENOUS | Status: DC | PRN
  Administered 2013-01-22: 60 mg via INTRAVENOUS

## 2013-01-22 MED ORDER — OXYCODONE HCL 5 MG PO TABS
10.0000 mg | ORAL_TABLET | ORAL | Status: DC | PRN
  Administered 2013-01-22 – 2013-01-23 (×5): 10 mg via ORAL
  Filled 2013-01-22 (×5): qty 2

## 2013-01-22 MED ORDER — 0.9 % SODIUM CHLORIDE (POUR BTL) OPTIME
TOPICAL | Status: DC | PRN
  Administered 2013-01-22: 1000 mL

## 2013-01-22 MED ORDER — VENLAFAXINE HCL ER 150 MG PO CP24
150.0000 mg | ORAL_CAPSULE | Freq: Every day | ORAL | Status: DC
  Administered 2013-01-22: 150 mg via ORAL
  Filled 2013-01-22 (×3): qty 1

## 2013-01-22 MED ORDER — ACETAMINOPHEN 650 MG RE SUPP: 650.0000 mg | RECTAL | Status: DC | PRN

## 2013-01-22 MED ORDER — FENTANYL CITRATE 0.05 MG/ML IJ SOLN
INTRAMUSCULAR | Status: DC | PRN
  Administered 2013-01-22 (×2): 100 ug via INTRAVENOUS
  Administered 2013-01-22: 50 ug via INTRAVENOUS

## 2013-01-22 MED ORDER — NEOSTIGMINE METHYLSULFATE 1 MG/ML IJ SOLN
INTRAMUSCULAR | Status: DC | PRN
  Administered 2013-01-22: 5 mg via INTRAVENOUS

## 2013-01-22 MED ORDER — KETOROLAC TROMETHAMINE 30 MG/ML IJ SOLN
15.0000 mg | Freq: Once | INTRAMUSCULAR | Status: AC | PRN
  Administered 2013-01-22: 30 mg via INTRAVENOUS

## 2013-01-22 MED ORDER — SODIUM CHLORIDE 0.9 % IV SOLN: 250.0000 mL | INTRAVENOUS | Status: DC

## 2013-01-22 MED ORDER — GLYCOPYRROLATE 0.2 MG/ML IJ SOLN
INTRAMUSCULAR | Status: DC | PRN
  Administered 2013-01-22: .8 mg via INTRAVENOUS

## 2013-01-22 MED ORDER — CEFAZOLIN SODIUM 1-5 GM-% IV SOLN
1.0000 g | Freq: Three times a day (TID) | INTRAVENOUS | Status: AC
  Administered 2013-01-22 – 2013-01-23 (×2): 1 g via INTRAVENOUS
  Filled 2013-01-22 (×2): qty 50

## 2013-01-22 MED ORDER — SENNOSIDES-DOCUSATE SODIUM 8.6-50 MG PO TABS: 1.0000 | ORAL_TABLET | Freq: Every evening | ORAL | Status: DC | PRN

## 2013-01-22 MED ORDER — METOPROLOL TARTRATE 1 MG/ML IV SOLN
INTRAVENOUS | Status: DC | PRN
  Administered 2013-01-22: 2.5 mg via INTRAVENOUS

## 2013-01-22 MED ORDER — DOCUSATE SODIUM 100 MG PO CAPS
100.0000 mg | ORAL_CAPSULE | Freq: Two times a day (BID) | ORAL | Status: DC
  Administered 2013-01-22 – 2013-01-23 (×2): 100 mg via ORAL
  Filled 2013-01-22 (×4): qty 1

## 2013-01-22 MED ORDER — PANTOPRAZOLE SODIUM 40 MG IV SOLR
40.0000 mg | Freq: Every day | INTRAVENOUS | Status: DC
  Administered 2013-01-22: 40 mg via INTRAVENOUS
  Filled 2013-01-22 (×2): qty 40

## 2013-01-22 MED ORDER — MORPHINE SULFATE 2 MG/ML IJ SOLN
1.0000 mg | INTRAMUSCULAR | Status: DC | PRN
  Administered 2013-01-22: 2 mg via INTRAVENOUS
  Filled 2013-01-22: qty 1

## 2013-01-22 MED ORDER — METHOCARBAMOL 100 MG/ML IJ SOLN
500.0000 mg | Freq: Four times a day (QID) | INTRAVENOUS | Status: DC | PRN
  Filled 2013-01-22: qty 5

## 2013-01-22 MED ORDER — FLEET ENEMA 7-19 GM/118ML RE ENEM: 1.0000 | ENEMA | Freq: Once | RECTAL | Status: AC | PRN

## 2013-01-22 MED ORDER — ARTIFICIAL TEARS OP OINT
TOPICAL_OINTMENT | OPHTHALMIC | Status: DC | PRN
  Administered 2013-01-22: 1 via OPHTHALMIC

## 2013-01-22 MED ORDER — AMLODIPINE BESYLATE 10 MG PO TABS
10.0000 mg | ORAL_TABLET | Freq: Every day | ORAL | Status: DC
  Administered 2013-01-22: 10 mg via ORAL
  Filled 2013-01-22 (×3): qty 1

## 2013-01-22 MED ORDER — METHOCARBAMOL 500 MG PO TABS
500.0000 mg | ORAL_TABLET | Freq: Four times a day (QID) | ORAL | Status: DC | PRN
  Administered 2013-01-23: 500 mg via ORAL
  Filled 2013-01-22: qty 1

## 2013-01-22 MED ORDER — HYDROMORPHONE HCL PF 1 MG/ML IJ SOLN
INTRAMUSCULAR | Status: AC
  Administered 2013-01-22: 0.5 mg
  Filled 2013-01-22: qty 1

## 2013-01-22 MED ORDER — MENTHOL 3 MG MT LOZG: 1.0000 | LOZENGE | OROMUCOSAL | Status: DC | PRN

## 2013-01-22 MED ORDER — ROCURONIUM BROMIDE 100 MG/10ML IV SOLN
INTRAVENOUS | Status: DC | PRN
  Administered 2013-01-22: 10 mg via INTRAVENOUS
  Administered 2013-01-22: 50 mg via INTRAVENOUS
  Administered 2013-01-22: 10 mg via INTRAVENOUS

## 2013-01-22 MED ORDER — SODIUM CHLORIDE 0.9 % IJ SOLN: 3.0000 mL | INTRAMUSCULAR | Status: DC | PRN

## 2013-01-22 MED ORDER — ACETAMINOPHEN 325 MG PO TABS: 650.0000 mg | ORAL_TABLET | ORAL | Status: DC | PRN

## 2013-01-22 MED ORDER — ONDANSETRON HCL 4 MG/2ML IJ SOLN: 4.0000 mg | INTRAMUSCULAR | Status: DC | PRN

## 2013-01-22 MED ORDER — HYDROCODONE-ACETAMINOPHEN 5-325 MG PO TABS
1.0000 | ORAL_TABLET | ORAL | Status: DC | PRN
  Administered 2013-01-23: 2 via ORAL
  Filled 2013-01-22: qty 2

## 2013-01-22 MED ORDER — HYDROCORTISONE 2.5 % RE CREA
TOPICAL_CREAM | Freq: Two times a day (BID) | RECTAL | Status: DC
  Filled 2013-01-22: qty 28.35

## 2013-01-22 MED ORDER — ONDANSETRON HCL 4 MG/2ML IJ SOLN: 4.0000 mg | Freq: Once | INTRAMUSCULAR | Status: DC | PRN

## 2013-01-22 MED ORDER — KCL IN DEXTROSE-NACL 20-5-0.45 MEQ/L-%-% IV SOLN
INTRAVENOUS | Status: DC
  Administered 2013-01-22: 18:00:00 via INTRAVENOUS
  Filled 2013-01-22 (×3): qty 1000

## 2013-01-22 MED ORDER — PHENYLEPHRINE HCL 10 MG/ML IJ SOLN
10.0000 mg | INTRAVENOUS | Status: DC | PRN
  Administered 2013-01-22: 25 ug/min via INTRAVENOUS

## 2013-01-22 SURGICAL SUPPLY — 61 items
BENZOIN TINCTURE PRP APPL 2/3 (GAUZE/BANDAGES/DRESSINGS) ×2 IMPLANT
BIT DRILL SRG 14X2.2XFLT CHK (BIT) ×1 IMPLANT
BIT DRL SRG 14X2.2XFLT CHK (BIT) ×1
BLADE SURG ROTATE 9660 (MISCELLANEOUS) ×2 IMPLANT
BONE CERV LORDOTIC 14.5X12X6 (Bone Implant) ×2 IMPLANT
BUR ROUND FLUTED 4 SOFT TCH (BURR) ×2 IMPLANT
CLOTH BEACON ORANGE TIMEOUT ST (SAFETY) ×2 IMPLANT
CLSR STERI-STRIP ANTIMIC 1/2X4 (GAUZE/BANDAGES/DRESSINGS) ×2 IMPLANT
COLLAR CERV LO CONTOUR FIRM DE (SOFTGOODS) ×2 IMPLANT
CORDS BIPOLAR (ELECTRODE) ×2 IMPLANT
COVER MAYO STAND STRL (DRAPES) ×2 IMPLANT
COVER SURGICAL LIGHT HANDLE (MISCELLANEOUS) ×2 IMPLANT
DERMABOND ADVANCED (GAUZE/BANDAGES/DRESSINGS)
DERMABOND ADVANCED .7 DNX12 (GAUZE/BANDAGES/DRESSINGS) IMPLANT
DRAPE C-ARM 42X72 X-RAY (DRAPES) ×2 IMPLANT
DRAPE MICROSCOPE LEICA (MISCELLANEOUS) ×2 IMPLANT
DRAPE PROXIMA HALF (DRAPES) ×2 IMPLANT
DRILL BIT SKYLINE 14MM (BIT) ×1
DRSG MEPILEX BORDER 4X4 (GAUZE/BANDAGES/DRESSINGS) IMPLANT
DRSG MEPILEX BORDER 4X8 (GAUZE/BANDAGES/DRESSINGS) IMPLANT
DURAPREP 6ML APPLICATOR 50/CS (WOUND CARE) ×2 IMPLANT
ELECT COATED BLADE 2.86 ST (ELECTRODE) ×2 IMPLANT
ELECT REM PT RETURN 9FT ADLT (ELECTROSURGICAL) ×2
ELECTRODE REM PT RTRN 9FT ADLT (ELECTROSURGICAL) ×1 IMPLANT
EVACUATOR 1/8 PVC DRAIN (DRAIN) ×2 IMPLANT
GAUZE XEROFORM 1X8 LF (GAUZE/BANDAGES/DRESSINGS) IMPLANT
GLOVE BIO SURGEON STRL SZ7 (GLOVE) ×6 IMPLANT
GLOVE BIOGEL PI IND STRL 7.0 (GLOVE) ×2 IMPLANT
GLOVE BIOGEL PI IND STRL 7.5 (GLOVE) ×1 IMPLANT
GLOVE BIOGEL PI IND STRL 8 (GLOVE) ×1 IMPLANT
GLOVE BIOGEL PI INDICATOR 7.0 (GLOVE) ×2
GLOVE BIOGEL PI INDICATOR 7.5 (GLOVE) ×1
GLOVE BIOGEL PI INDICATOR 8 (GLOVE) ×1
GLOVE ECLIPSE 7.0 STRL STRAW (GLOVE) ×2 IMPLANT
GLOVE ORTHO TXT STRL SZ7.5 (GLOVE) ×2 IMPLANT
GOWN PREVENTION PLUS LG XLONG (DISPOSABLE) ×2 IMPLANT
GOWN PREVENTION PLUS XLARGE (GOWN DISPOSABLE) ×2 IMPLANT
GOWN STRL NON-REIN LRG LVL3 (GOWN DISPOSABLE) IMPLANT
GOWN STRL REIN XL XLG (GOWN DISPOSABLE) ×4 IMPLANT
HEAD HALTER (SOFTGOODS) ×2 IMPLANT
HEMOSTAT SURGICEL 2X14 (HEMOSTASIS) IMPLANT
KIT BASIN OR (CUSTOM PROCEDURE TRAY) ×2 IMPLANT
KIT ROOM TURNOVER OR (KITS) ×2 IMPLANT
MANIFOLD NEPTUNE II (INSTRUMENTS) IMPLANT
NEEDLE 25GX 5/8IN NON SAFETY (NEEDLE) ×2 IMPLANT
NS IRRIG 1000ML POUR BTL (IV SOLUTION) ×2 IMPLANT
PACK ORTHO CERVICAL (CUSTOM PROCEDURE TRAY) ×2 IMPLANT
PAD ARMBOARD 7.5X6 YLW CONV (MISCELLANEOUS) ×4 IMPLANT
PATTIES SURGICAL .5 X.5 (GAUZE/BANDAGES/DRESSINGS) ×2 IMPLANT
PLATE ONE LEVEL SKYLINE 14MM (Plate) ×2 IMPLANT
SCREW VAR SELF TAP SKYLINE 14M (Screw) ×2 IMPLANT
SPONGE GAUZE 4X4 12PLY (GAUZE/BANDAGES/DRESSINGS) ×2 IMPLANT
SURGIFLO TRUKIT (HEMOSTASIS) IMPLANT
SUT VIC AB 3-0 X1 27 (SUTURE) ×2 IMPLANT
SUT VICRYL 4-0 PS2 18IN ABS (SUTURE) ×4 IMPLANT
SYR 30ML SLIP (SYRINGE) ×2 IMPLANT
SYR BULB 3OZ (MISCELLANEOUS) ×2 IMPLANT
TAPE CLOTH SURG 4X10 WHT LF (GAUZE/BANDAGES/DRESSINGS) ×2 IMPLANT
TOWEL OR 17X24 6PK STRL BLUE (TOWEL DISPOSABLE) ×2 IMPLANT
TOWEL OR 17X26 10 PK STRL BLUE (TOWEL DISPOSABLE) ×2 IMPLANT
WATER STERILE IRR 1000ML POUR (IV SOLUTION) ×2 IMPLANT

## 2013-01-22 NOTE — Transfer of Care (Signed)
Immediate Anesthesia Transfer of Care Note  Patient: Jerry Vance  Procedure(s) Performed: Procedure(s) with comments: ANTERIOR CERVICAL DECOMPRESSION/DISCECTOMY FUSION 1 LEVEL (N/A) - C5-6 Anterior Cervical Discectomy and Fusion, Allograft, Plate  Patient Location: PACU  Anesthesia Type:General  Level of Consciousness: awake, alert  and oriented  Airway & Oxygen Therapy: Patient connected to face mask  Post-op Assessment: Report given to PACU RN  Post vital signs: stable  Complications: No apparent anesthesia complications

## 2013-01-22 NOTE — Interval H&P Note (Signed)
History and Physical Interval Note:  01/22/2013 12:29 PM  Jerry Vance  has presented today for surgery, with the diagnosis of C5-6 Spondylosis, HNP  The various methods of treatment have been discussed with the patient and family. After consideration of risks, benefits and other options for treatment, the patient has consented to  Procedure(s) with comments: ANTERIOR CERVICAL DECOMPRESSION/DISCECTOMY FUSION 1 LEVEL (N/A) - C5-6 Anterior Cervical Discectomy and Fusion, Allograft, Plate as a surgical intervention .  The patient's history has been reviewed, patient examined, no change in status, stable for surgery.  I have reviewed the patient's chart and labs.  Questions were answered to the patient's satisfaction.     Madisen Ludvigsen C

## 2013-01-22 NOTE — Anesthesia Preprocedure Evaluation (Addendum)
Anesthesia Evaluation  Patient identified by MRN, date of birth, ID band Patient awake    Reviewed: Allergy & Precautions, H&P , NPO status , Patient's Chart, lab work & pertinent test results, reviewed documented beta blocker date and time   Airway Mallampati: II TM Distance: >3 FB Neck ROM: Full    Dental  (+) Teeth Intact and Dental Advisory Given   Pulmonary neg pulmonary ROS,  breath sounds clear to auscultation        Cardiovascular hypertension, Pt. on medications Rhythm:Regular Rate:Normal     Neuro/Psych  Headaches, Anxiety Depression    GI/Hepatic negative GI ROS, Neg liver ROS,   Endo/Other  negative endocrine ROS  Renal/GU Renal disease     Musculoskeletal negative musculoskeletal ROS (+)   Abdominal (+) + obese,  Abdomen: soft. Bowel sounds: normal.  Peds  Hematology negative hematology ROS (+)   Anesthesia Other Findings   Reproductive/Obstetrics negative OB ROS                       Anesthesia Physical Anesthesia Plan  ASA: III  Anesthesia Plan: General   Post-op Pain Management:    Induction: Intravenous  Airway Management Planned: Oral ETT  Additional Equipment:   Intra-op Plan:   Post-operative Plan: Extubation in OR  Informed Consent: I have reviewed the patients History and Physical, chart, labs and discussed the procedure including the risks, benefits and alternatives for the proposed anesthesia with the patient or authorized representative who has indicated his/her understanding and acceptance.   Dental advisory given  Plan Discussed with: CRNA and Anesthesiologist  Anesthesia Plan Comments: (Htn HNP C5-6  Plan GA with oral ETT  Kipp Brood, MD)        Anesthesia Quick Evaluation

## 2013-01-22 NOTE — Anesthesia Procedure Notes (Addendum)
Procedure Name: Intubation Date/Time: 01/22/2013 1:00 PM Performed by: Ellin Goodie Pre-anesthesia Checklist: Patient identified, Emergency Drugs available, Suction available, Patient being monitored and Timeout performed Patient Re-evaluated:Patient Re-evaluated prior to inductionOxygen Delivery Method: Circle system utilized Preoxygenation: Pre-oxygenation with 100% oxygen Intubation Type: IV induction Ventilation: Mask ventilation without difficulty Laryngoscope Size: Mac and 4 Grade View: Grade III Tube type: Oral Tube size: 7.5 mm Number of attempts: 2 Airway Equipment and Method: Stylet Placement Confirmation: ETT inserted through vocal cords under direct vision,  positive ETCO2 and breath sounds checked- equal and bilateral Secured at: 22 cm Tube secured with: Tape Dental Injury: Teeth and Oropharynx as per pre-operative assessment  Comments: Easy induction and mask.  Good view of vocal cords with MAC 4 blade but unable to pass ETT easily through cords.  Dr. Chaney Malling intubated with MAC 4 blade and verified placement of ETT.  Carlynn Herald, CRNA   Performed by: Alben Spittle, Hurshel Keys

## 2013-01-22 NOTE — Brief Op Note (Signed)
01/22/2013  2:20 PM  PATIENT:  Jerry Vance  47 y.o. male  PRE-OPERATIVE DIAGNOSIS:  C5-6 Spondylosis, HNP  POST-OPERATIVE DIAGNOSIS:  C5-6 Spondylosis, HNP  PROCEDURE:  Procedure(s) with comments: ANTERIOR CERVICAL DECOMPRESSION/DISCECTOMY FUSION 1 LEVEL (N/A) - C5-6 Anterior Cervical Discectomy and Fusion, Allograft, Plate  SURGEON:  Surgeon(s) and Role:    * Eldred Manges, MD - Primary  PHYSICIAN ASSISTANT: Maud Deed [facility]  ASSISTANTS: none   ANESTHESIA:   general  EBL:  Total I/O In: 1000 [I.V.:1000] Out: -   BLOOD ADMINISTERED:none  DRAINS: (1) Hemovact drain(s) in the anterior neck with  Suction Open   LOCAL MEDICATIONS USED:  MARCAINE     SPECIMEN:  No Specimen  DISPOSITION OF SPECIMEN:  N/A  COUNTS:  YES  TOURNIQUET:  * No tourniquets in log *  DICTATION: .Note written in EPIC  PLAN OF CARE: Admit to inpatient   PATIENT DISPOSITION:  PACU - hemodynamically stable.   Delay start of Pharmacological VTE agent (>24hrs) due to surgical blood loss or risk of bleeding: yes

## 2013-01-22 NOTE — Anesthesia Postprocedure Evaluation (Signed)
  Anesthesia Post-op Note  Patient: Jerry Vance  Procedure(s) Performed: Procedure(s) with comments: ANTERIOR CERVICAL DECOMPRESSION/DISCECTOMY FUSION 1 LEVEL (N/A) - C5-6 Anterior Cervical Discectomy and Fusion, Allograft, Plate  Patient Location: PACU  Anesthesia Type:General  Level of Consciousness: awake, alert  and oriented  Airway and Oxygen Therapy: Patient Spontanous Breathing and Patient connected to nasal cannula oxygen  Post-op Pain: mild  Post-op Assessment: Post-op Vital signs reviewed, Patient's Cardiovascular Status Stable, Respiratory Function Stable, Patent Airway, No signs of Nausea or vomiting and Pain level controlled  Post-op Vital Signs: stable  Complications: No apparent anesthesia complications

## 2013-01-22 NOTE — Preoperative (Signed)
Beta Blockers   Reason not to administer Beta Blockers:Not Applicable 

## 2013-01-22 NOTE — Interval H&P Note (Signed)
History and Physical Interval Note:  01/22/2013 12:28 PM  Jerry Vance  has presented today for surgery, with the diagnosis of C5-6 Spondylosis, HNP  The various methods of treatment have been discussed with the patient and family. After consideration of risks, benefits and other options for treatment, the patient has consented to  Procedure(s) with comments: ANTERIOR CERVICAL DECOMPRESSION/DISCECTOMY FUSION 1 LEVEL (N/A) - C5-6 Anterior Cervical Discectomy and Fusion, Allograft, Plate as a surgical intervention .  The patient's history has been reviewed, patient examined, no change in status, stable for surgery.  I have reviewed the patient's chart and labs.  Questions were answered to the patient's satisfaction.     [name] C

## 2013-01-23 NOTE — Progress Notes (Signed)
Patient d/c to home with family.  IV removed.  Prescriptions given and instructions reviewed.

## 2013-01-23 NOTE — Progress Notes (Signed)
Subjective: 1 Day Post-Op Procedure(s) (LRB): ANTERIOR CERVICAL DECOMPRESSION/DISCECTOMY FUSION 1 LEVEL (N/A) Patient reports pain as mild.  Mostly sore.  Minimal sore throat and no difficulty swallowing. Walking in room. Voiding well  Objective: Vital signs in last 24 hours: Temp:  [98.2 F (36.8 C)-98.8 F (37.1 C)] 98.6 F (37 C) (11/18 0554) Pulse Rate:  [76-108] 76 (11/18 0554) Resp:  [14-22] 18 (11/18 0554) BP: (115-153)/(69-93) 139/79 mmHg (11/18 0554) SpO2:  [91 %-97 %] 96 % (11/18 0554)  Intake/Output from previous day: 11/17 0701 - 11/18 0700 In: 1910 [I.V.:1910] Out: 550 [Urine:550] Intake/Output this shift: Total I/O In: 240 [P.O.:240] Out: 400 [Urine:400]  No results found for this basename: HGB,  in the last 72 hours No results found for this basename: WBC, RBC, HCT, PLT,  in the last 72 hours No results found for this basename: NA, K, CL, CO2, BUN, CREATININE, GLUCOSE, CALCIUM,  in the last 72 hours No results found for this basename: LABPT, INR,  in the last 72 hours  Neurovascular intact Incision: hemovac removed.  no drainage.  Assessment/Plan: 1 Day Post-Op Procedure(s) (LRB): ANTERIOR CERVICAL DECOMPRESSION/DISCECTOMY FUSION 1 LEVEL (N/A) Advance diet D/C IV fluids Discharge home. Ov 10-14 days rx percocet.  Miachel Nardelli M 01/23/2013, 11:25 AM

## 2013-01-23 NOTE — Care Management Utilization Note (Signed)
Utilization review completed. Shauntee Karp, RN BSN 

## 2013-01-23 NOTE — Op Note (Signed)
Jerry Vance, Jerry Vance                ACCOUNT NO.:  [id number]  MEDICAL RECORD NO.:  [id number]  LOCATION:  5N13C                        FACILITY:  [facility]  PHYSICIAN:  Algernon Mundie C. Ophelia Charter, M.D.    DATE OF BIRTH:  16-Aug-1965  DATE OF PROCEDURE:  01/22/2013 DATE OF DISCHARGE:                              OPERATIVE REPORT   PREOPERATIVE DIAGNOSIS:  C5-6 spondylosis with left radiculopathy.  POSTOPERATIVE DIAGNOSIS:  C5-6 spondylosis with left radiculopathy.  PROCEDURE:  C5-6 anterior cervical diskectomy and fusion, microscope assisted, Allograft and plate, Synthes 14 mm screws, 14 mm plate.  SURGEON:  Samanta Gal C. Ophelia Charter, M.D.  ASSISTANT:  Maud Deed, PA-C, medically necessary and present for the entire procedure.  ESTIMATED BLOOD LOSS:  Minimal.  ANESTHESIA:  GOT plus Marcaine skin local.  COMPLICATIONS:  None.  DRAINS:  1 Hemovac neck.  PROCEDURE IN DETAIL:  After induction of general anesthesia, orotracheal intubation, head halter traction wrap application, arms tucked to the side, with yellow foam pads for protection of the ulnar nerve.  Neck was prepped with DuraPrep.  As the prepping was dried, time-out procedure was completed.  Ancef was given prophylactically and had already infused, starting with induction of anesthesia.  Neck was prepped with DuraPrep.  The area was squared with towels, sterile skin marker were used.  Betadine, Steri-Drape, sterile Mayo stand at the head, thyroid sheets and drapes.  Incision was started at the midline, extended to the left.  Platysma was then divided in line with the fibers, blunt dissection above the omohyoid down to the palpable large spur at C5-6, which was single level head significant spurring.  There was disk space narrowing, combination of disk and osteophyte formation with left paracentral and foraminal narrowing due to combination of hard and soft disk material.  Short 25 needle was placed and the space confirmed the lateral C-arm  that this was the appropriate level.  Diskectomy was performed.  Operative microscope was brought in and chunks of disks were removed going back to the posterior and longitudinal ligament, there was considerable amount of disk material pushed back behind the vertebral bodies with thickening of the ligaments and very significant spurring worse on the left than right, corresponding with the symptoms, 1 and 2- mm Kerrisons were used for microdissection, removal of the spurs, decompression until the dura was completely decompressed, visualized all the way across the backside.  Due to the narrowing of the disk space from the patient's progressive spondylosis, 6 mm trial gave a nice tight fit, 6-mm allograft was selected, placed with CRNA pulling on head halter traction to distract the space, insertion of the disk.  All spurs had been removed.  There was egress for fluid on each side.  Operative field was dry and endplates had been prepared with both the bur as well as hand-held curettes and rasping the smooth end plate.  Good tight fit with 6 mm graft.  Traction was relaxed with the CRNA, 14 mm plate was placed.  Prong was placed, checked under C-arm AP and lateral, then filled with four 14 mm screws.  Final x-ray confirmation of position and alignment. Instrument count and needle count was correct.  Closure of  all 3-0 platysma, Vicryl closure 4-0 subcuticular closure, Tincture of benzoin, Steri-Strips, Marcaine infiltration, neck 4 x 4s tape, and soft cervical collar.  Procedure was as posted.     Kerney Hopfensperger C. Ophelia Charter, M.D.     MCY/MEDQ  D:  01/22/2013  T:  01/23/2013  Job:  [id number]

## 2013-01-24 ENCOUNTER — Encounter (HOSPITAL_COMMUNITY): Payer: Self-pay | Admitting: Orthopaedic Surgery

## 2013-01-29 NOTE — Discharge Summary (Signed)
Physician Discharge Summary  Patient ID: Jerry Vance MRN: [id number] DOB/AGE: 05/19/1965 47 y.o.  Admit date: 01/22/2013 Discharge date: 01/23/2013  Admission Diagnoses:  HNP (herniated nucleus pulposus), cervical 5-6 and spondylosis  Discharge Diagnoses:  Principal Problem:   HNP (herniated nucleus pulposus), cervical C5-6  Past Medical History  Diagnosis Date  . Hypertension   . Depression   . Stone, kidney 2013    hx-stones  -current left  8mm stone x2  . Headache(784.0)     Surgeries: Procedure(s): ANTERIOR CERVICAL DECOMPRESSION/DISCECTOMY  FUSION C5-6, 1 LEVEL on 01/22/2013   Consultants (if any):  none  Discharged Condition: Improved  Hospital Course: Jerry Vance is an 47 y.o. male who was admitted 01/22/2013 with a diagnosis of HNP (herniated nucleus pulposus), cervical and went to the operating room on 01/22/2013 and underwent the above named procedures.    He was given perioperative antibiotics:  Anti-infectives   Start     Dose/Rate Route Frequency Ordered Stop   01/22/13 2000  ceFAZolin (ANCEF) IVPB 1 g/50 mL premix     1 g 100 mL/hr over 30 Minutes Intravenous Every 8 hours 01/22/13 1600 01/23/13 0517   01/22/13 0600  ceFAZolin (ANCEF) IVPB 2 g/50 mL premix     2 g 100 mL/hr over 30 Minutes Intravenous On call to O.R. 01/21/13 1232 01/22/13 1307    .  He was given sequential compression devices, early ambulation for DVT prophylaxis.  He benefited maximally from the hospital stay and there were no complications.    Recent vital signs:  Filed Vitals:   01/23/13 0554  BP: 139/79  Pulse: 76  Temp: 98.6 F (37 C)  Resp: 18    Recent laboratory studies:  Lab Results  Component Value Date   HGB 15.5 01/12/2013   HGB 13.9 11/16/2011   Lab Results  Component Value Date   WBC 8.5 01/12/2013   PLT 257 01/12/2013   Lab Results  Component Value Date   INR 0.94 01/12/2013   Lab Results  Component Value Date   NA 137 01/12/2013   K 3.6  01/12/2013   CL 101 01/12/2013   CO2 23 01/12/2013   BUN 14 01/12/2013   CREATININE 0.81 01/12/2013   GLUCOSE 101* 01/12/2013    Discharge Medications:     Medication List         amLODipine 10 MG tablet  Commonly known as:  NORVASC  Take 10 mg by mouth daily.     hydrocortisone 2.5 % rectal cream  Commonly known as:  ANUSOL-HC  Place rectally 2 (two) times daily.     naproxen sodium 220 MG tablet  Commonly known as:  ANAPROX  Take 220 mg by mouth 2 (two) times daily with a meal.     oxyCODONE-acetaminophen 5-325 MG per tablet  Commonly known as:  PERCOCET/ROXICET  Take 1-2 tablets by mouth every 4 (four) hours as needed.     venlafaxine XR 75 MG 24 hr capsule  Commonly known as:  EFFEXOR-XR  Take 150 mg by mouth daily.        Diagnostic Studies: Dg Chest 2 View  01/12/2013   CLINICAL DATA:  Preoperative evaluation for spine fusion, history hypertension  EXAM: CHEST  2 VIEW  COMPARISON:  04/19/2012  FINDINGS: Normal heart size, mediastinal contours, and pulmonary vascularity.  Lungs clear.  Bones unremarkable.  No pneumothorax.  IMPRESSION: Normal exam.   Electronically Signed   By: Ulyses Southward M.D.   On: 01/12/2013 16:23  Dg Cervical Spine 2-3 Views  01/22/2013   CLINICAL DATA:  Neck pain  EXAM: CERVICAL SPINE - 2-3 VIEW; DG C-ARM 1-60 MIN  COMPARISON:  MRI 11/11/2012.  FINDINGS: C-arm films document C5-6 ACDF. Lower aspect of the fusion construct is poorly visualized. Plain films recommended postoperatively.  IMPRESSION: C5-6 fusion as described.   Electronically Signed   By: Davonna Belling M.D.   On: 01/22/2013 15:11   Dg C-arm 1-60 Min  01/22/2013   CLINICAL DATA:  Neck pain  EXAM: CERVICAL SPINE - 2-3 VIEW; DG C-ARM 1-60 MIN  COMPARISON:  MRI 11/11/2012.  FINDINGS: C-arm films document C5-6 ACDF. Lower aspect of the fusion construct is poorly visualized. Plain films recommended postoperatively.  IMPRESSION: C5-6 fusion as described.   Electronically Signed   By: Davonna Belling M.D.   On: 01/22/2013 15:11    Disposition: 01-Home or Self Care      Discharge Orders   Future Orders Complete By Expires   Call MD / Call 911  As directed    Comments:     If you experience chest pain or shortness of breath, CALL 911 and be transported to the hospital emergency room.  If you develope a fever above 101 F, pus (white drainage) or increased drainage or redness at the wound, or calf pain, call your surgeon's office.   Constipation Prevention  As directed    Comments:     Drink plenty of fluids.  Prune juice may be helpful.  You may use a stool softener, such as Colace (over the counter) 100 mg twice a day.  Use MiraLax (over the counter) for constipation as needed.   Diet - low sodium heart healthy  As directed    Discharge instructions  As directed    Comments:     No lifting greater than 10 lbs. No overhead use of arms. Avoid bending,and twisting neck. Walk in house for first week them may start to get out slowly increasing distance up to one mile by 3 weeks post op. Keep incision dry for 4 days, may then bathe and wet incision using a bandage and  covered collar when showering. Call if any fevers >101, chills, or increasing numbness or weakness or increased swelling or drainage. Ice packs as needed.   Increase activity slowly as tolerated  As directed       Follow-up Information   Follow up with YATES,MARK C, MD. Schedule an appointment as soon as possible for a visit in 2 weeks.   Specialty:  Orthopedic Surgery   Contact information:   [address] [place] [address] [phone]        Signed: Wende Neighbors 01/29/2013, 4:15 PM

## 2014-12-09 ENCOUNTER — Emergency Department (HOSPITAL_COMMUNITY): Payer: 59

## 2014-12-09 ENCOUNTER — Encounter (HOSPITAL_COMMUNITY): Payer: Self-pay | Admitting: Emergency Medicine

## 2014-12-09 ENCOUNTER — Emergency Department (HOSPITAL_COMMUNITY)
Admission: EM | Admit: 2014-12-09 | Discharge: 2014-12-09 | Disposition: A | Discharge status: Home / Self Care | Payer: 59 | Attending: Emergency Medicine | Admitting: Emergency Medicine

## 2014-12-09 DIAGNOSIS — Z8659 Personal history of other mental and behavioral disorders: Secondary | ICD-10-CM | POA: Insufficient documentation

## 2014-12-09 DIAGNOSIS — I10 Essential (primary) hypertension: Secondary | ICD-10-CM | POA: Diagnosis not present

## 2014-12-09 DIAGNOSIS — N201 Calculus of ureter: Secondary | ICD-10-CM | POA: Insufficient documentation

## 2014-12-09 DIAGNOSIS — R109 Unspecified abdominal pain: Secondary | ICD-10-CM | POA: Diagnosis present

## 2014-12-09 LAB — URINALYSIS, ROUTINE W REFLEX MICROSCOPIC
Bilirubin Urine: NEGATIVE
GLUCOSE, UA: NEGATIVE mg/dL
Ketones, ur: NEGATIVE mg/dL
LEUKOCYTES UA: NEGATIVE
Nitrite: NEGATIVE
PH: 6 (ref 5.0–8.0)
Specific Gravity, Urine: >1.03 — ABNORMAL HIGH (ref 1.005–1.030)
Urobilinogen, UA: 0.2 mg/dL (ref 0.0–1.0)

## 2014-12-09 LAB — CBC WITH DIFFERENTIAL/PLATELET
Basophils Absolute: 0.1 10*3/uL (ref 0.0–0.1)
Basophils Relative: 1 %
Eosinophils Absolute: 0.2 10*3/uL (ref 0.0–0.7)
Eosinophils Relative: 2 %
HCT: 43.9 % (ref 39.0–52.0)
HEMOGLOBIN: 14.9 g/dL (ref 13.0–17.0)
LYMPHS ABS: 1.3 10*3/uL (ref 0.7–4.0)
LYMPHS PCT: 11 %
MCH: 30 pg (ref 26.0–34.0)
MCHC: 33.9 g/dL (ref 30.0–36.0)
MCV: 88.5 fL (ref 78.0–100.0)
MONOS PCT: 6 %
Monocytes Absolute: 0.7 10*3/uL (ref 0.1–1.0)
NEUTROS PCT: 80 %
Neutro Abs: 9.6 10*3/uL — ABNORMAL HIGH (ref 1.7–7.7)
Platelets: 254 10*3/uL (ref 150–400)
RBC: 4.96 MIL/uL (ref 4.22–5.81)
RDW: 13.9 % (ref 11.5–15.5)
WBC: 11.9 10*3/uL — AB (ref 4.0–10.5)

## 2014-12-09 LAB — BASIC METABOLIC PANEL
Anion gap: 8 (ref 5–15)
BUN: 13 mg/dL (ref 6–20)
CO2: 21 mmol/L — AB (ref 22–32)
Calcium: 8.8 mg/dL — ABNORMAL LOW (ref 8.9–10.3)
Chloride: 111 mmol/L (ref 101–111)
Creatinine, Ser: 1.13 mg/dL (ref 0.61–1.24)
GFR calc Af Amer: >60 mL/min (ref >60)
GFR calc non Af Amer: >60 mL/min (ref >60)
Glucose, Bld: 111 mg/dL — ABNORMAL HIGH (ref 65–99)
POTASSIUM: 4 mmol/L (ref 3.5–5.1)
SODIUM: 140 mmol/L (ref 135–145)

## 2014-12-09 LAB — URINE MICROSCOPIC-ADD ON

## 2014-12-09 MED ORDER — NAPROXEN 500 MG PO TABS: 500.0000 mg | ORAL_TABLET | Freq: Two times a day (BID) | ORAL | Status: AC

## 2014-12-09 MED ORDER — FENTANYL CITRATE (PF) 100 MCG/2ML IJ SOLN
50.0000 ug | Freq: Once | INTRAMUSCULAR | Status: AC
  Administered 2014-12-09: 50 ug via INTRAVENOUS
  Filled 2014-12-09: qty 2

## 2014-12-09 MED ORDER — ONDANSETRON HCL 4 MG/2ML IJ SOLN
4.0000 mg | Freq: Once | INTRAMUSCULAR | Status: AC
  Administered 2014-12-09: 4 mg via INTRAVENOUS
  Filled 2014-12-09: qty 2

## 2014-12-09 MED ORDER — SODIUM CHLORIDE 0.9 % IV BOLUS (SEPSIS)
1000.0000 mL | Freq: Once | INTRAVENOUS | Status: AC
  Administered 2014-12-09: 1000 mL via INTRAVENOUS

## 2014-12-09 MED ORDER — PROMETHAZINE HCL 25 MG PO TABS: 25.0000 mg | ORAL_TABLET | Freq: Four times a day (QID) | ORAL | Status: AC | PRN

## 2014-12-09 MED ORDER — HYDROCODONE-ACETAMINOPHEN 5-325 MG PO TABS: 1.0000 | ORAL_TABLET | Freq: Four times a day (QID) | ORAL | Status: AC | PRN

## 2014-12-09 MED ORDER — KETOROLAC TROMETHAMINE 30 MG/ML IJ SOLN
30.0000 mg | Freq: Once | INTRAMUSCULAR | Status: AC
  Administered 2014-12-09: 30 mg via INTRAVENOUS
  Filled 2014-12-09: qty 1

## 2014-12-09 MED ORDER — SODIUM CHLORIDE 0.9 % IV SOLN
INTRAVENOUS | Status: DC
  Administered 2014-12-09: 16:00:00 via INTRAVENOUS

## 2014-12-09 NOTE — ED Notes (Signed)
Patient complaining of left flank pain starting 2 hours ago. States he has history of kidney stones.

## 2014-12-09 NOTE — Discharge Instructions (Signed)
Take Naprosyn as directed. Supplement with hydrocodone as needed for additional pain relief. Take the Phenergan as needed for nausea and vomiting. We expect you to past kidney stone in the next 24 hours if not follow-up with urology referral information provided. Return for any new or worse symptoms. Work note provided.

## 2014-12-09 NOTE — ED Provider Notes (Signed)
CSN: [id number]     Arrival date & time 12/09/14  1457 History   First MD Initiated Contact with Patient 12/09/14 1521     Chief Complaint  Patient presents with  . Flank Pain     (Consider location/radiation/quality/duration/timing/severity/associated sxs/prior Treatment) Patient is a 49 y.o. male presenting with flank pain. The history is provided by the patient and the spouse.  Flank Pain Associated symptoms include abdominal pain. Pertinent negatives include no chest pain, no headaches and no shortness of breath.   patient with acute onset of left flank pain at 12 noon. Patient has a history of kidney stones. Last one was 10 months ago. Not associated with fever no nausea no vomiting. Pain is 10 out of 10 fairly significant left flank radiating towards the left lower quadrant. Patient currently does not have a urologist.  Past Medical History  Diagnosis Date  . Hypertension   . Depression   . Stone, kidney 2013    hx-stones  -current left  8mm stone x2  . [id number](956.2)    Past Surgical History  Procedure Laterality Date  . Ureteroscopy  11/16/2011    Procedure: URETEROSCOPY;  Surgeon: Antony Haste, MD;  Location: [facility];  Service: Urology;  Laterality: Left;  . Wrist surgery      laceration repair from cut glass  . Anterior cervical decomp/discectomy fusion N/A 01/22/2013    Procedure: ANTERIOR CERVICAL DECOMPRESSION/DISCECTOMY FUSION 1 LEVEL;  Surgeon: Eldred Manges, MD;  Location: [facility] OR;  Service: Orthopedics;  Laterality: N/A;  C5-6 Anterior Cervical Discectomy and Fusion, Allograft, Plate   Family History  Problem Relation Age of Onset  . Cancer Mother     lung  . Cancer Maternal Grandfather     colon   Social History  Substance Use Topics  . Smoking status: Never Smoker   . Smokeless tobacco: None  . Alcohol Use: No    Review of Systems  Constitutional: Negative for fever.  HENT: Negative for congestion.   Eyes: Negative for  visual disturbance.  Respiratory: Negative for shortness of breath.   Cardiovascular: Negative for chest pain.  Gastrointestinal: Positive for abdominal pain. Negative for nausea and vomiting.  Genitourinary: Positive for flank pain.  Musculoskeletal: Negative for back pain.  Skin: Negative for rash.  Neurological: Negative for headaches.  Hematological: Does not bruise/bleed easily.  Psychiatric/Behavioral: Negative for confusion.      Allergies  Review of patient's allergies indicates no known allergies.  Home Medications   Prior to Admission medications   Medication Sig Start Date End Date Taking? Authorizing Provider  HYDROcodone-acetaminophen (NORCO/VICODIN) 5-325 MG tablet Take 1-2 tablets by mouth every 6 (six) hours as needed. 12/09/14   Vanetta Mulders, MD  naproxen (NAPROSYN) 500 MG tablet Take 1 tablet (500 mg total) by mouth 2 (two) times daily. 12/09/14   Vanetta Mulders, MD  promethazine (PHENERGAN) 25 MG tablet Take 1 tablet (25 mg total) by mouth every 6 (six) hours as needed. 12/09/14   Vanetta Mulders, MD   BP 147/96 mmHg  Pulse 70  Temp(Src) 97.4 F (36.3 C) (Oral)  Resp 18  Ht  (1.803 m)  Wt 240 lb (108.863 kg)  BMI 33.49 kg/m2  SpO2 95% Physical Exam  Constitutional: He is oriented to person, place, and time. He appears well-developed and well-nourished. He appears distressed.  HENT:  Head: Normocephalic and atraumatic.  Mouth/Throat: Oropharynx is clear and moist.  Eyes: Conjunctivae and EOM are normal. Pupils are equal, round, and  reactive to light.  Neck: Normal range of motion. Neck supple.  Cardiovascular: Normal rate and normal heart sounds.   No murmur heard. Pulmonary/Chest: Effort normal and breath sounds normal. No respiratory distress.  Abdominal: Soft. Bowel sounds are normal. There is no tenderness.  Musculoskeletal: Normal range of motion.  Neurological: He is alert and oriented to person, place, and time. No cranial nerve deficit.  He exhibits normal muscle tone. Coordination normal.  Skin: Skin is warm. No rash noted.  Nursing note and vitals reviewed.   ED Course  Procedures (including critical care time) Labs Review Labs Reviewed  BASIC METABOLIC PANEL - Abnormal; Notable for the following:    CO2 21 (*)    Glucose, Bld 111 (*)    Calcium 8.8 (*)    All other components within normal limits  CBC WITH DIFFERENTIAL/PLATELET - Abnormal; Notable for the following:    WBC 11.9 (*)    Neutro Abs 9.6 (*)    All other components within normal limits  URINALYSIS, ROUTINE W REFLEX MICROSCOPIC (NOT AT [facility]) - Abnormal; Notable for the following:    Color, Urine AMBER (*)    APPearance HAZY (*)    Specific Gravity, Urine >1.030 (*)    Hgb urine dipstick LARGE (*)    Protein, ur TRACE (*)    All other components within normal limits  URINE MICROSCOPIC-ADD ON - Abnormal; Notable for the following:    Bacteria, UA FEW (*)    All other components within normal limits   Results for orders placed or performed during the hospital encounter of 12/09/14  Basic metabolic panel  Result Value Ref Range   Sodium 140 135 - 145 mmol/L   Potassium 4.0 3.5 - 5.1 mmol/L   Chloride 111 101 - 111 mmol/L   CO2 21 (L) 22 - 32 mmol/L   Glucose, Bld 111 (H) 65 - 99 mg/dL   BUN 13 6 - 20 mg/dL   Creatinine, Ser 1.61 0.61 - 1.24 mg/dL   Calcium 8.8 (L) 8.9 - 10.3 mg/dL   GFR calc non Af Amer >60 >60 mL/min   GFR calc Af Amer >60 >60 mL/min   Anion gap 8 5 - 15  CBC with Differential/Platelet  Result Value Ref Range   WBC 11.9 (H) 4.0 - 10.5 K/uL   RBC 4.96 4.22 - 5.81 MIL/uL   Hemoglobin 14.9 13.0 - 17.0 g/dL   HCT 09.6 04.5 - 40.9 %   MCV 88.5 78.0 - 100.0 fL   MCH 30.0 26.0 - 34.0 pg   MCHC 33.9 30.0 - 36.0 g/dL   RDW 81.1 91.4 - 78.2 %   Platelets 254 150 - 400 K/uL   Neutrophils Relative % 80 %   Neutro Abs 9.6 (H) 1.7 - 7.7 K/uL   Lymphocytes Relative 11 %   Lymphs Abs 1.3 0.7 - 4.0 K/uL   Monocytes Relative 6 %    Monocytes Absolute 0.7 0.1 - 1.0 K/uL   Eosinophils Relative 2 %   Eosinophils Absolute 0.2 0.0 - 0.7 K/uL   Basophils Relative 1 %   Basophils Absolute 0.1 0.0 - 0.1 K/uL  Urinalysis, Routine w reflex microscopic (not at [facility])  Result Value Ref Range   Color, Urine AMBER (A) YELLOW   APPearance HAZY (A) CLEAR   Specific Gravity, Urine >1.030 (H) 1.005 - 1.030   pH 6.0 5.0 - 8.0   Glucose, UA NEGATIVE NEGATIVE mg/dL   Hgb urine dipstick LARGE (A) NEGATIVE   Bilirubin  Urine NEGATIVE NEGATIVE   Ketones, ur NEGATIVE NEGATIVE mg/dL   Protein, ur TRACE (A) NEGATIVE mg/dL   Urobilinogen, UA 0.2 0.0 - 1.0 mg/dL   Nitrite NEGATIVE NEGATIVE   Leukocytes, UA NEGATIVE NEGATIVE  Urine microscopic-add on  Result Value Ref Range   Squamous Epithelial / LPF RARE RARE   WBC, UA 0-2 <3 WBC/hpf   RBC / HPF TOO NUMEROUS TO COUNT <3 RBC/hpf   Bacteria, UA FEW (A) RARE   Urine-Other MUCOUS PRESENT     Imaging Review Ct Renal Stone Study  12/09/2014   CLINICAL DATA:  Left flank pain for 3 hours. History of kidney stones. Initial encounter.  EXAM: CT ABDOMEN AND PELVIS WITHOUT CONTRAST  TECHNIQUE: Multidetector CT imaging of the abdomen and pelvis was performed following the standard protocol without IV contrast.  COMPARISON:  CT 11/05/2011 and 06/24/2013.  FINDINGS: Lower chest: Clear lung bases. No significant pleural or pericardial effusion.  Hepatobiliary: The hepatic density is diffusely decreased, consistent with steatosis. No focal lesions identified on noncontrast imaging. No evidence of gallstones, gallbladder wall thickening or biliary dilatation.  Pancreas: Unremarkable. No pancreatic ductal dilatation or surrounding inflammatory changes.  Spleen: Normal in size without focal abnormality.  Adrenals/Urinary Tract: Both adrenal glands appear normal. No renal calculi are demonstrated. There is recurrent left-sided hydronephrosis with minimal perinephric soft tissue stranding. There is an obstructing 5  mm calculus in the distal left ureter on image number 72. This is located approximately 2 cm proximal to the ureterovesical junction. No other urinary tract calculi demonstrated.  Stomach/Bowel: No evidence of bowel wall thickening, distention or surrounding inflammatory change. The appendix appears normal.  Vascular/Lymphatic: There are no enlarged abdominal or pelvic lymph nodes. Mild atherosclerosis of the iliac arteries.  Reproductive: Stable dystrophic calcifications within the prostate gland.  Other: Stable asymmetric fat in the right inguinal canal and small umbilical hernia containing only fat.  Musculoskeletal: No acute or significant osseous findings.  IMPRESSION: 1. Partially obstructing 5 mm calculus in the distal left ureter. 2. No other urinary tract calculi. 3. Hepatic steatosis.   Electronically Signed   By: Carey Bullocks M.D.   On: 12/09/2014 16:22   I have personally reviewed and evaluated these images and lab results as part of my medical decision-making.   EKG Interpretation None      MDM   Final diagnoses:  Flank pain  Left ureteral stone    CT scan consistent with patient's clinical findings of the left flank pain. Shows 5 mm left ureteral stone. Labs without any significant abnormalities. Patient's pain improved here. We'll give referral to urology will continue treatment with Naprosyn and hydrocodone and Phenergan.   Vanetta Mulders, MD 12/09/14 [phone]

## 2014-12-09 NOTE — ED Notes (Signed)
MD Zackowski at bedside updating patient and family. 

## 2015-11-09 IMAGING — CT CT RENAL STONE PROTOCOL
2 of 4 series · 16 of 46 positions shown, 18 images · non-contrast
Comparison: CT 11/05/2011 and 06/24/2013.

CLINICAL DATA: Left flank pain for 3 hours. History of kidney
stones. Initial encounter.

EXAM:
CT ABDOMEN AND PELVIS WITHOUT CONTRAST
TECHNIQUE: Multidetector CT imaging of the abdomen and pelvis was performed
following the standard protocol without IV contrast.

[Series 2: standard/full over (age)lbs 5.0 · axial · 0.87mm/px · z∈[-478,-43]mm · 13 of 95 slices shown, 15 images]
[im 4/95  soft-tissue]
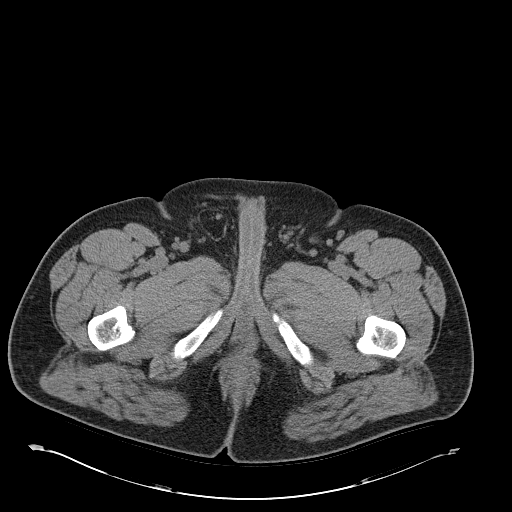
[im 4/95  bone]
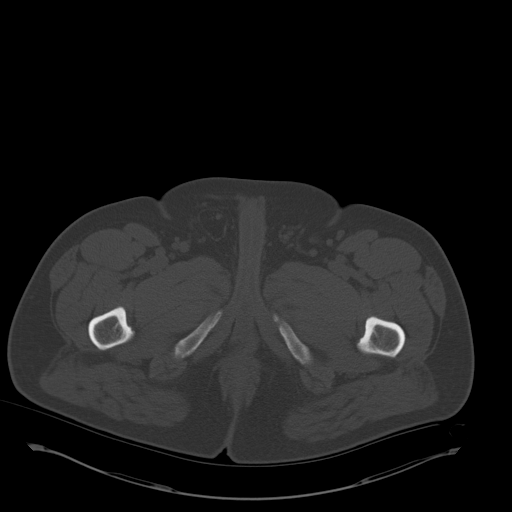
[im 12/95  soft-tissue]
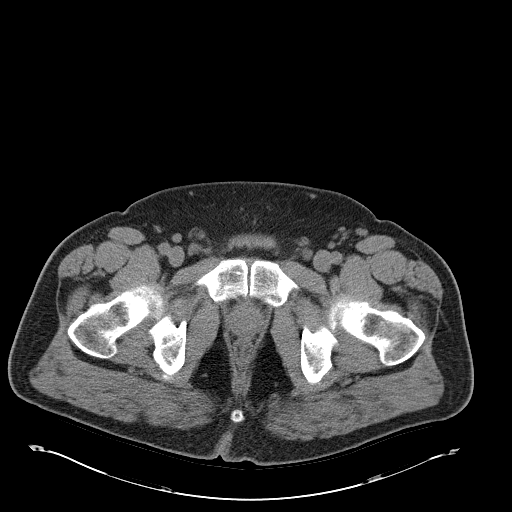
[im 19/95  soft-tissue]
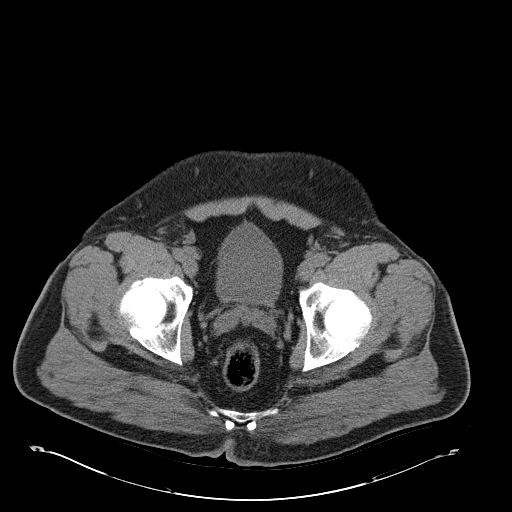
[im 27/95  soft-tissue]
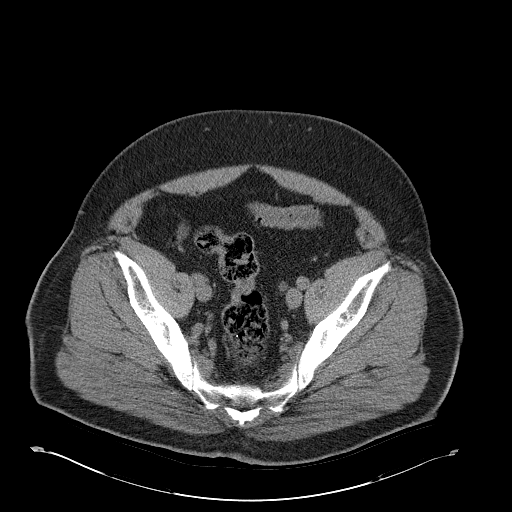
[im 34/95  soft-tissue]
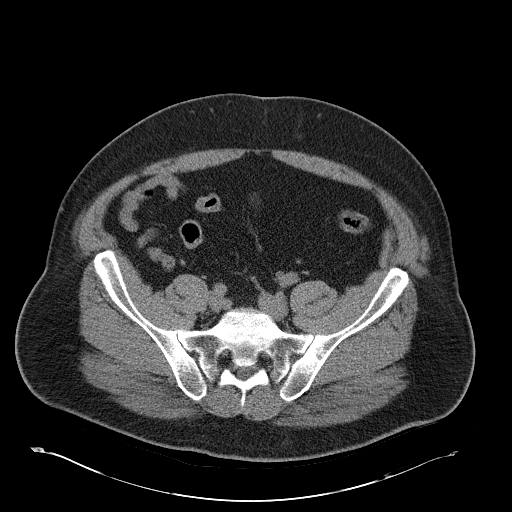
[im 42/95  soft-tissue]
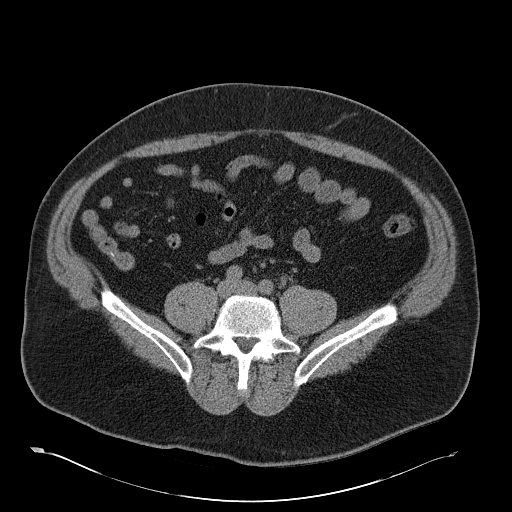
[im 49/95  soft-tissue]
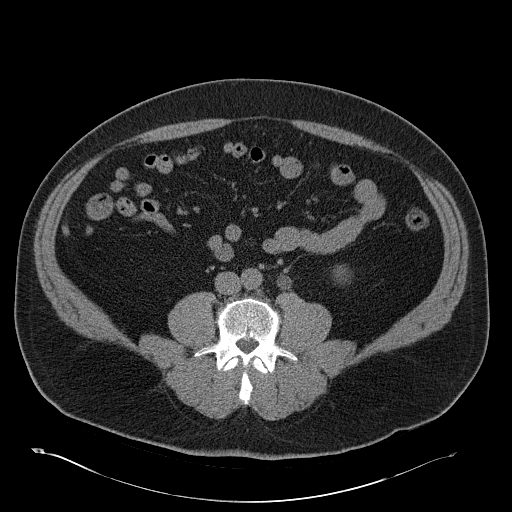
[im 53/95  soft-tissue]
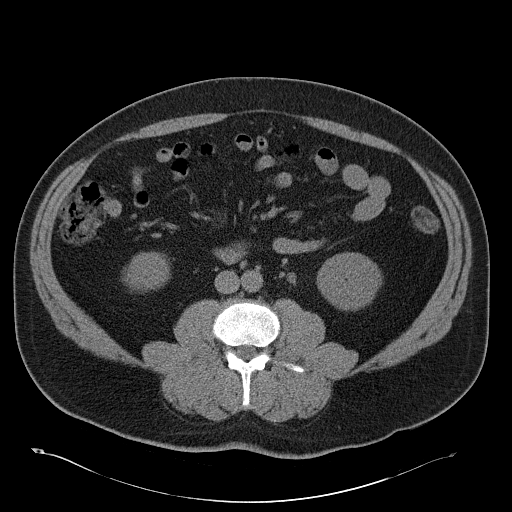
[im 61/95  soft-tissue]
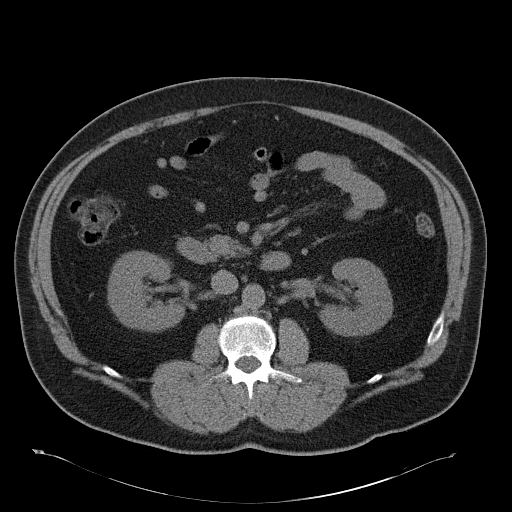
[im 61/95  bone]
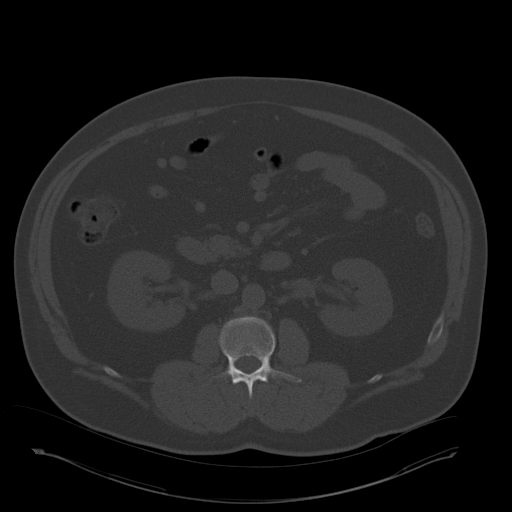
[im 68/95  soft-tissue]
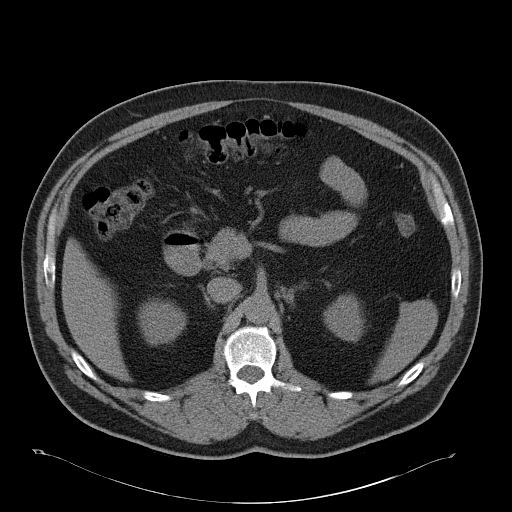
[im 76/95  soft-tissue]
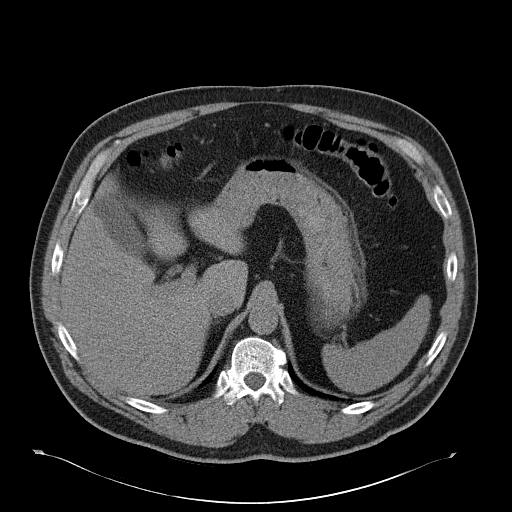
[im 83/95  soft-tissue]
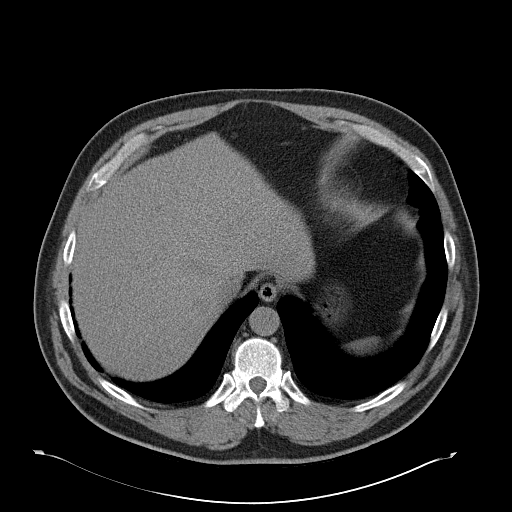
[im 91/95  soft-tissue]
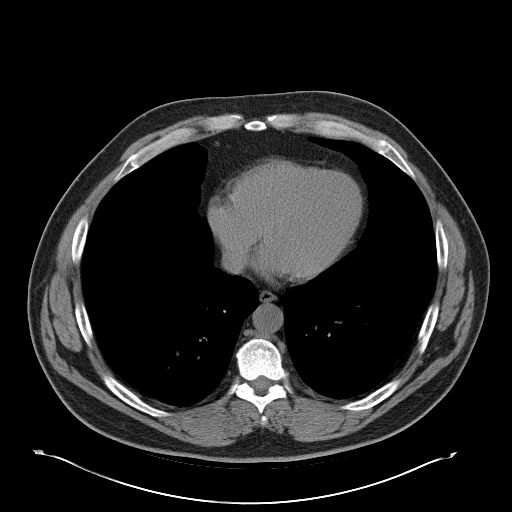

[Series 4: mpr coronal · coronal · 0.82mm/px · 3 of 104 slices shown]
[im 35/104  soft-tissue]
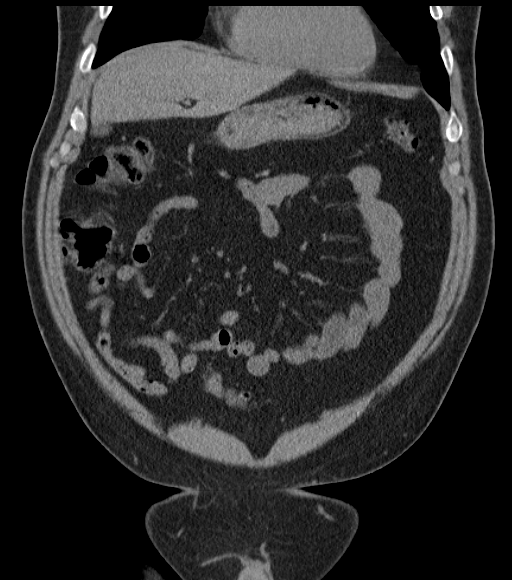
[im 46/104  soft-tissue]
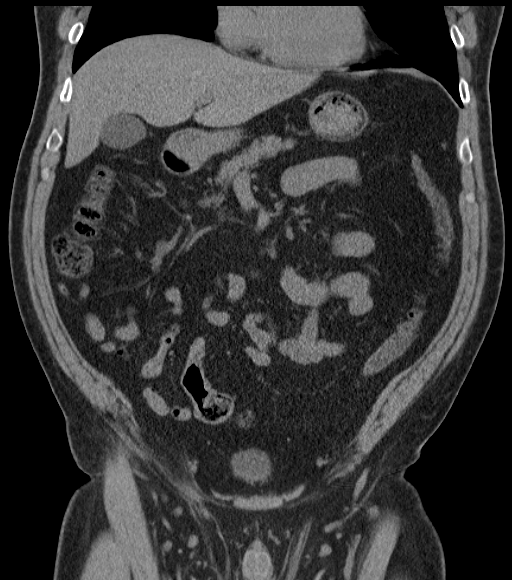
[im 58/104  soft-tissue]
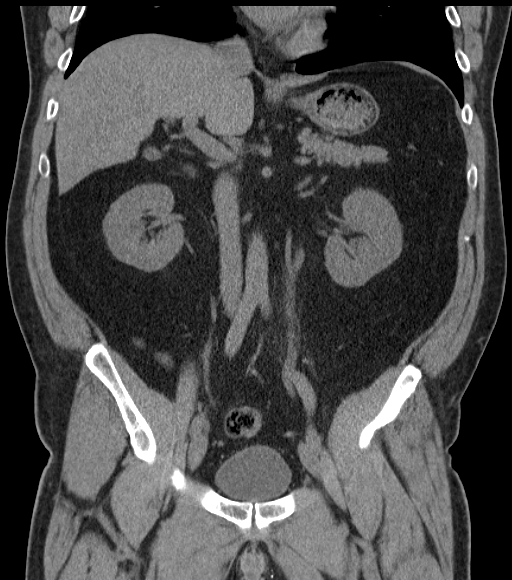

[16 of 46 positions shown; findings below may reference images not displayed]

FINDINGS: Lower chest: Clear lung bases. No significant pleural or pericardial
effusion.

Hepatobiliary: The hepatic density is diffusely decreased,
consistent with steatosis. No focal lesions identified on
noncontrast imaging. No evidence of gallstones, gallbladder wall
thickening or biliary dilatation.

Pancreas: Unremarkable. No pancreatic ductal dilatation or
surrounding inflammatory changes.

Spleen: Normal in size without focal abnormality.

Adrenals/Urinary Tract: Both adrenal glands appear normal. No renal
calculi are demonstrated. There is recurrent left-sided
hydronephrosis with minimal perinephric soft tissue stranding. There
is an obstructing 5 mm calculus in the distal left ureter on image
number 72. This is located approximately 2 cm proximal to the
ureterovesical junction. No other urinary tract calculi
demonstrated.

Stomach/Bowel: No evidence of bowel wall thickening, distention or
surrounding inflammatory change. The appendix appears normal.

Vascular/Lymphatic: There are no enlarged abdominal or pelvic lymph
nodes. Mild atherosclerosis of the iliac arteries.

Reproductive: Stable dystrophic calcifications within the prostate
gland.

Other: Stable asymmetric fat in the right inguinal canal and small
umbilical hernia containing only fat.

Musculoskeletal: No acute or significant osseous findings.
IMPRESSION: 1. Partially obstructing 5 mm calculus in the distal left ureter.
2. No other urinary tract calculi.
3. Hepatic steatosis.

## 2015-12-29 ENCOUNTER — Other Ambulatory Visit (HOSPITAL_COMMUNITY): Payer: Self-pay | Admitting: Gastroenterology

## 2015-12-29 DIAGNOSIS — R1011 Right upper quadrant pain: Secondary | ICD-10-CM

## 2016-01-21 ENCOUNTER — Other Ambulatory Visit (HOSPITAL_COMMUNITY): Payer: Managed Care, Other (non HMO)

## 2016-01-21 ENCOUNTER — Ambulatory Visit (HOSPITAL_COMMUNITY): Payer: Managed Care, Other (non HMO)

## 2018-03-31 ENCOUNTER — Ambulatory Visit (HOSPITAL_COMMUNITY): Payer: Self-pay | Admitting: Psychiatry

## 2020-01-30 ENCOUNTER — Institutional Professional Consult (permissible substitution): Payer: 59 | Admitting: Neurology
